# Patient Record
Sex: Male | Born: 1960 | Race: White | Hispanic: No | Marital: Single | Smoking: Former smoker
Health system: Southern US, Community
[De-identification: ages and names within clinical notes are randomized; demographics above are authoritative.]

## PROBLEM LIST (undated history)

## (undated) ENCOUNTER — Emergency Department: Discharge status: Absent without leave | Payer: Medicare Other

## (undated) DIAGNOSIS — I4891 Unspecified atrial fibrillation: Secondary | ICD-10-CM

## (undated) DIAGNOSIS — I2510 Atherosclerotic heart disease of native coronary artery without angina pectoris: Secondary | ICD-10-CM

## (undated) DIAGNOSIS — N289 Disorder of kidney and ureter, unspecified: Secondary | ICD-10-CM

## (undated) DIAGNOSIS — E785 Hyperlipidemia, unspecified: Secondary | ICD-10-CM

## (undated) HISTORY — PX: CARPAL TUNNEL RELEASE: SHX101

## (undated) HISTORY — PX: CORONARY ARTERY BYPASS GRAFT: SHX141

## (undated) HISTORY — DX: Hyperlipidemia, unspecified: E78.5

## (undated) HISTORY — DX: Atherosclerotic heart disease of native coronary artery without angina pectoris: I25.10

## (undated) HISTORY — PX: OTHER SURGICAL HISTORY: SHX169

---

## 2004-09-23 ENCOUNTER — Inpatient Hospital Stay (HOSPITAL_COMMUNITY): Admission: EM | Admit: 2004-09-23 | Discharge: 2004-09-25 | Payer: Self-pay | Admitting: Emergency Medicine

## 2007-07-14 ENCOUNTER — Emergency Department: Payer: Self-pay | Admitting: Unknown Physician Specialty

## 2007-11-04 ENCOUNTER — Other Ambulatory Visit: Payer: Self-pay

## 2007-11-04 ENCOUNTER — Inpatient Hospital Stay: Payer: Self-pay | Admitting: Internal Medicine

## 2008-11-29 ENCOUNTER — Ambulatory Visit: Payer: Self-pay | Admitting: Cardiology

## 2008-11-29 ENCOUNTER — Inpatient Hospital Stay (HOSPITAL_COMMUNITY): Admission: EM | Admit: 2008-11-29 | Discharge: 2008-12-09 | Payer: Self-pay | Admitting: Emergency Medicine

## 2008-12-01 ENCOUNTER — Encounter: Payer: Self-pay | Admitting: Surgery

## 2008-12-01 ENCOUNTER — Ambulatory Visit: Payer: Self-pay | Admitting: Surgery

## 2008-12-19 ENCOUNTER — Ambulatory Visit: Payer: Self-pay | Admitting: Surgery

## 2008-12-22 ENCOUNTER — Ambulatory Visit: Payer: Self-pay | Admitting: Cardiology

## 2009-01-01 ENCOUNTER — Encounter: Admission: RE | Admit: 2009-01-01 | Discharge: 2009-01-01 | Payer: Self-pay | Admitting: Surgery

## 2009-01-01 ENCOUNTER — Ambulatory Visit: Payer: Self-pay | Admitting: Surgery

## 2009-04-16 IMAGING — CR DG CHEST 2V
2 series · 2 of 2 positions shown · non-contrast
Comparison: 12/08/2007

CLINICAL DATA: Coronary artery disease.  CABG.

CHEST - 2 VIEW

[w chest pa]
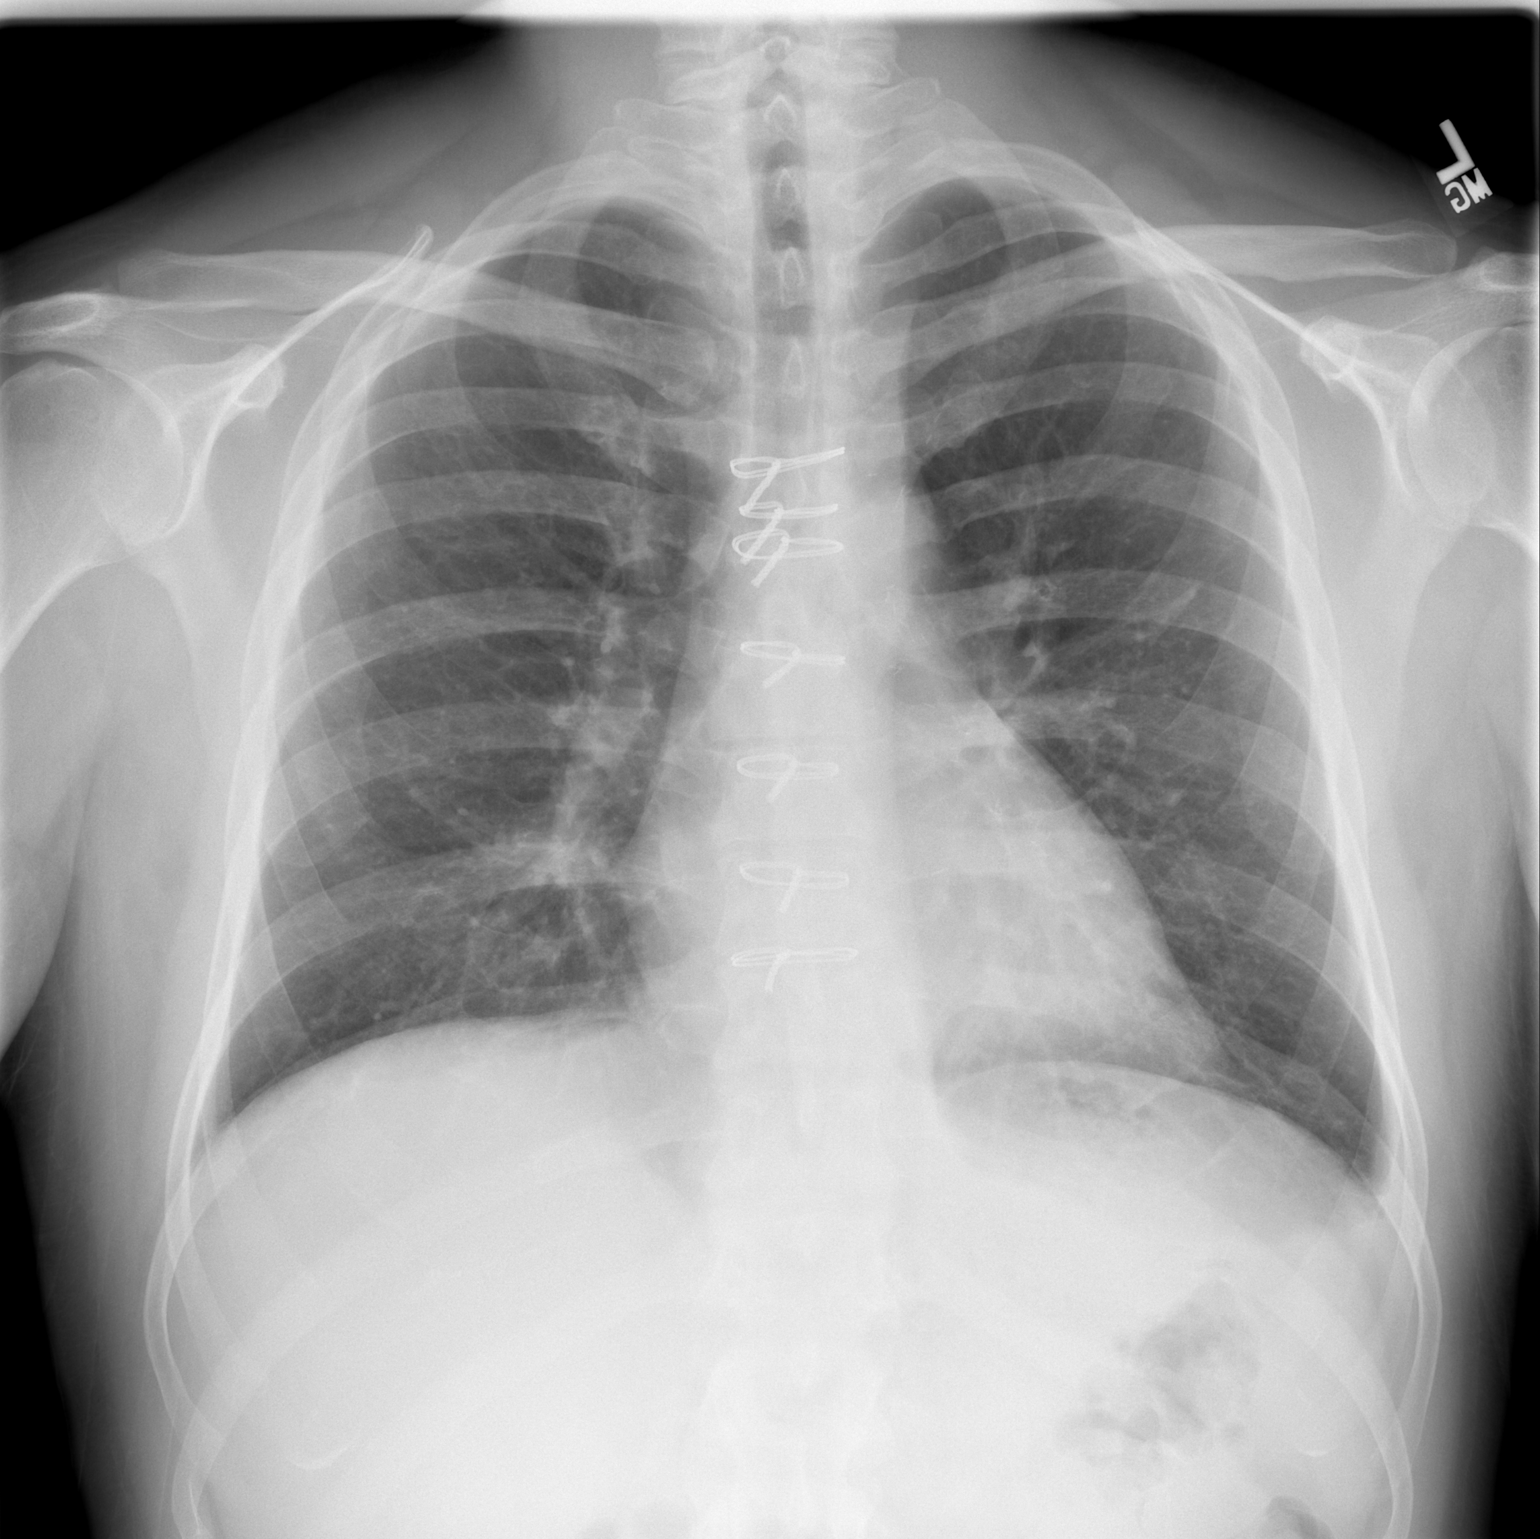

[w chest lat]
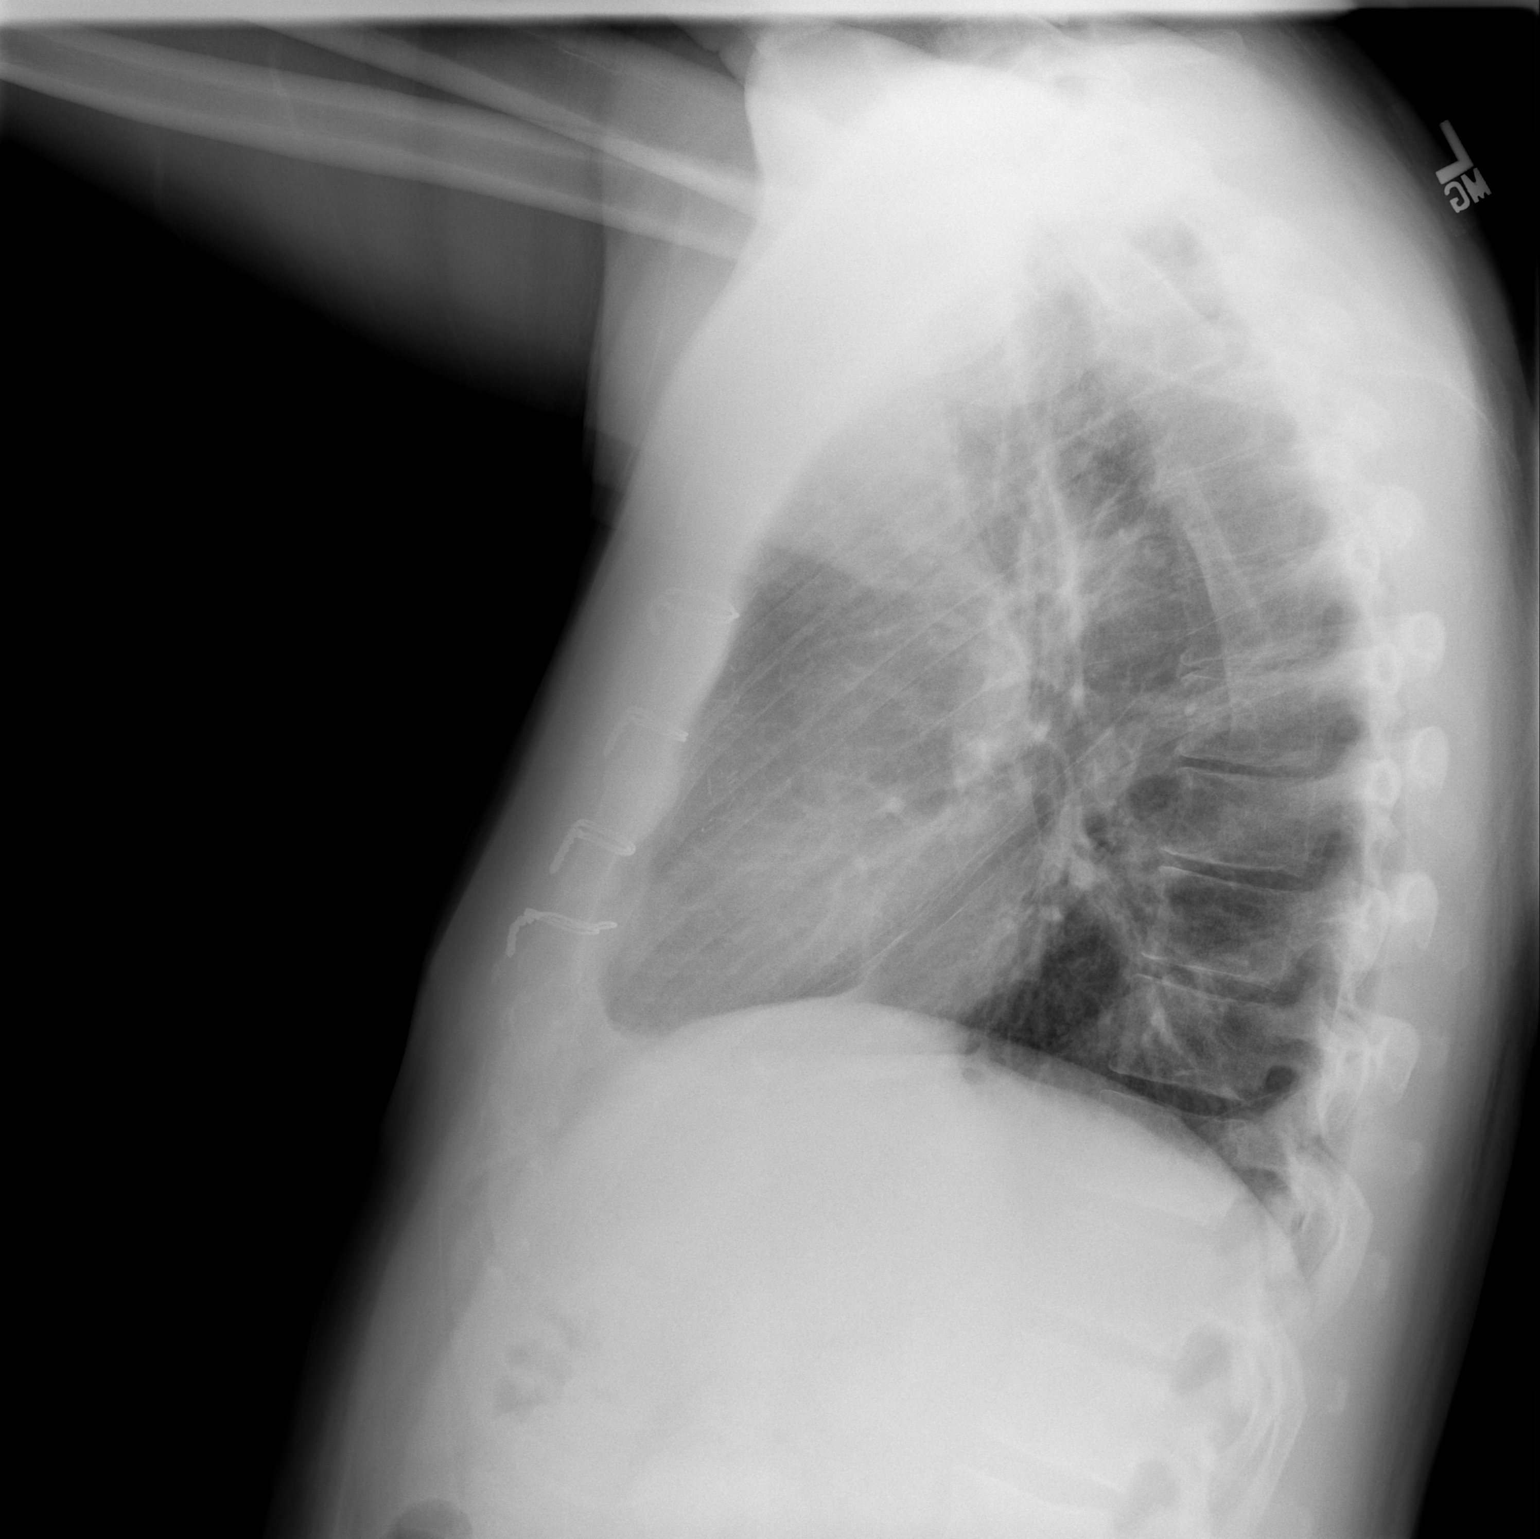

[2 of 2 positions shown; findings below may reference images not displayed]

FINDINGS: Continued normalization of the postoperative chest.
Median sternotomy wires are present.  No pleural effusion or
airspace disease.  Trachea midline.  Cardiopericardial silhouette
within normal limits.
IMPRESSION: No active cardiopulmonary disease status post CABG.

## 2009-10-26 DIAGNOSIS — E785 Hyperlipidemia, unspecified: Secondary | ICD-10-CM

## 2009-10-26 DIAGNOSIS — I25119 Atherosclerotic heart disease of native coronary artery with unspecified angina pectoris: Secondary | ICD-10-CM | POA: Insufficient documentation

## 2010-01-24 ENCOUNTER — Emergency Department: Payer: Self-pay | Admitting: Unknown Physician Specialty

## 2011-01-14 ENCOUNTER — Inpatient Hospital Stay: Payer: Self-pay | Admitting: Internal Medicine

## 2011-03-10 LAB — URINALYSIS, ROUTINE W REFLEX MICROSCOPIC
Glucose, UA: >1000 mg/dL — AB
Hgb urine dipstick: NEGATIVE
Leukocytes, UA: NEGATIVE
Protein, ur: NEGATIVE mg/dL
pH: 6 (ref 5.0–8.0)

## 2011-03-10 LAB — BASIC METABOLIC PANEL
BUN: 12 mg/dL (ref 6–23)
BUN: 13 mg/dL (ref 6–23)
BUN: 19 mg/dL (ref 6–23)
CO2: 21 mEq/L (ref 19–32)
CO2: 23 mEq/L (ref 19–32)
CO2: 24 mEq/L (ref 19–32)
CO2: 24 mEq/L (ref 19–32)
CO2: 25 mEq/L (ref 19–32)
CO2: 26 mEq/L (ref 19–32)
Calcium: 8.3 mg/dL — ABNORMAL LOW (ref 8.4–10.5)
Calcium: 8.6 mg/dL (ref 8.4–10.5)
Calcium: 8.8 mg/dL (ref 8.4–10.5)
Calcium: 9.3 mg/dL (ref 8.4–10.5)
Chloride: 105 mEq/L (ref 96–112)
Chloride: 107 mEq/L (ref 96–112)
Chloride: 108 mEq/L (ref 96–112)
Chloride: 109 mEq/L (ref 96–112)
Chloride: 99 mEq/L (ref 96–112)
Creatinine, Ser: 0.9 mg/dL (ref 0.4–1.5)
Creatinine, Ser: 1 mg/dL (ref 0.4–1.5)
Creatinine, Ser: 1.04 mg/dL (ref 0.4–1.5)
Creatinine, Ser: 1.08 mg/dL (ref 0.4–1.5)
GFR calc Af Amer: >60 mL/min (ref >60)
GFR calc Af Amer: >60 mL/min (ref >60)
GFR calc Af Amer: >60 mL/min (ref >60)
GFR calc non Af Amer: >60 mL/min (ref >60)
GFR calc non Af Amer: >60 mL/min (ref >60)
Glucose, Bld: 103 mg/dL — ABNORMAL HIGH (ref 70–99)
Glucose, Bld: 111 mg/dL — ABNORMAL HIGH (ref 70–99)
Glucose, Bld: 182 mg/dL — ABNORMAL HIGH (ref 70–99)
Glucose, Bld: 360 mg/dL — ABNORMAL HIGH (ref 70–99)
Potassium: 3.5 mEq/L (ref 3.5–5.1)
Potassium: 3.7 mEq/L (ref 3.5–5.1)
Potassium: 3.8 mEq/L (ref 3.5–5.1)
Sodium: 133 mEq/L — ABNORMAL LOW (ref 135–145)
Sodium: 136 mEq/L (ref 135–145)
Sodium: 138 mEq/L (ref 135–145)
Sodium: 140 mEq/L (ref 135–145)

## 2011-03-10 LAB — GLUCOSE, CAPILLARY
Glucose-Capillary: 113 mg/dL — ABNORMAL HIGH (ref 70–99)
Glucose-Capillary: 121 mg/dL — ABNORMAL HIGH (ref 70–99)
Glucose-Capillary: 121 mg/dL — ABNORMAL HIGH (ref 70–99)
Glucose-Capillary: 122 mg/dL — ABNORMAL HIGH (ref 70–99)
Glucose-Capillary: 123 mg/dL — ABNORMAL HIGH (ref 70–99)
Glucose-Capillary: 125 mg/dL — ABNORMAL HIGH (ref 70–99)
Glucose-Capillary: 151 mg/dL — ABNORMAL HIGH (ref 70–99)
Glucose-Capillary: 157 mg/dL — ABNORMAL HIGH (ref 70–99)
Glucose-Capillary: 167 mg/dL — ABNORMAL HIGH (ref 70–99)
Glucose-Capillary: 177 mg/dL — ABNORMAL HIGH (ref 70–99)
Glucose-Capillary: 178 mg/dL — ABNORMAL HIGH (ref 70–99)
Glucose-Capillary: 185 mg/dL — ABNORMAL HIGH (ref 70–99)
Glucose-Capillary: 200 mg/dL — ABNORMAL HIGH (ref 70–99)
Glucose-Capillary: 203 mg/dL — ABNORMAL HIGH (ref 70–99)
Glucose-Capillary: 210 mg/dL — ABNORMAL HIGH (ref 70–99)
Glucose-Capillary: 247 mg/dL — ABNORMAL HIGH (ref 70–99)
Glucose-Capillary: 250 mg/dL — ABNORMAL HIGH (ref 70–99)
Glucose-Capillary: 263 mg/dL — ABNORMAL HIGH (ref 70–99)
Glucose-Capillary: 321 mg/dL — ABNORMAL HIGH (ref 70–99)
Glucose-Capillary: 327 mg/dL — ABNORMAL HIGH (ref 70–99)
Glucose-Capillary: 91 mg/dL (ref 70–99)
Glucose-Capillary: 94 mg/dL (ref 70–99)

## 2011-03-10 LAB — URINE MICROSCOPIC-ADD ON

## 2011-03-10 LAB — POCT I-STAT, CHEM 8
Chloride: 109 mEq/L (ref 96–112)
Creatinine, Ser: 1 mg/dL (ref 0.4–1.5)
Glucose, Bld: 151 mg/dL — ABNORMAL HIGH (ref 70–99)
Glucose, Bld: 211 mg/dL — ABNORMAL HIGH (ref 70–99)
HCT: 37 % — ABNORMAL LOW (ref 39.0–52.0)
HCT: 38 % — ABNORMAL LOW (ref 39.0–52.0)
Hemoglobin: 12.6 g/dL — ABNORMAL LOW (ref 13.0–17.0)
Hemoglobin: 12.9 g/dL — ABNORMAL LOW (ref 13.0–17.0)
Potassium: 3.7 mEq/L (ref 3.5–5.1)
Potassium: 4.4 mEq/L (ref 3.5–5.1)
Sodium: 139 mEq/L (ref 135–145)
Sodium: 142 mEq/L (ref 135–145)
TCO2: 20 mmol/L (ref 0–100)

## 2011-03-10 LAB — HEMOGLOBIN A1C
Hgb A1c MFr Bld: 11.8 % — ABNORMAL HIGH (ref 4.6–6.1)
Mean Plasma Glucose: 292 mg/dL

## 2011-03-10 LAB — CBC
HCT: 39.3 % (ref 39.0–52.0)
HCT: 40 % (ref 39.0–52.0)
HCT: 44.2 % (ref 39.0–52.0)
HCT: 45.3 % (ref 39.0–52.0)
Hemoglobin: 12.8 g/dL — ABNORMAL LOW (ref 13.0–17.0)
Hemoglobin: 13.1 g/dL (ref 13.0–17.0)
Hemoglobin: 13.2 g/dL (ref 13.0–17.0)
Hemoglobin: 13.5 g/dL (ref 13.0–17.0)
Hemoglobin: 14.8 g/dL (ref 13.0–17.0)
Hemoglobin: 15.6 g/dL (ref 13.0–17.0)
MCHC: 33 g/dL (ref 30.0–36.0)
MCHC: 33.4 g/dL (ref 30.0–36.0)
MCHC: 33.6 g/dL (ref 30.0–36.0)
MCHC: 34.4 g/dL (ref 30.0–36.0)
MCHC: 34.5 g/dL (ref 30.0–36.0)
MCHC: 34.8 g/dL (ref 30.0–36.0)
MCV: 89.1 fL (ref 78.0–100.0)
MCV: 90.7 fL (ref 78.0–100.0)
MCV: 91.7 fL (ref 78.0–100.0)
Platelets: 183 10*3/uL (ref 150–400)
Platelets: 212 10*3/uL (ref 150–400)
Platelets: 214 10*3/uL (ref 150–400)
Platelets: 269 10*3/uL (ref 150–400)
RBC: 4.19 MIL/uL — ABNORMAL LOW (ref 4.22–5.81)
RBC: 4.25 MIL/uL (ref 4.22–5.81)
RBC: 4.37 MIL/uL (ref 4.22–5.81)
RBC: 5.08 MIL/uL (ref 4.22–5.81)
RDW: 12.9 % (ref 11.5–15.5)
RDW: 13.1 % (ref 11.5–15.5)
RDW: 13.4 % (ref 11.5–15.5)
RDW: 13.4 % (ref 11.5–15.5)
RDW: 13.5 % (ref 11.5–15.5)
RDW: 13.8 % (ref 11.5–15.5)
WBC: 7.6 10*3/uL (ref 4.0–10.5)
WBC: 8.3 10*3/uL (ref 4.0–10.5)
WBC: 8.4 10*3/uL (ref 4.0–10.5)

## 2011-03-10 LAB — TSH
TSH: 1.3 u[IU]/mL (ref 0.350–4.500)
TSH: 2.987 u[IU]/mL (ref 0.350–4.500)

## 2011-03-10 LAB — POCT I-STAT 4, (NA,K, GLUC, HGB,HCT)
Glucose, Bld: 105 mg/dL — ABNORMAL HIGH (ref 70–99)
Glucose, Bld: 107 mg/dL — ABNORMAL HIGH (ref 70–99)
HCT: 40 % (ref 39.0–52.0)
Hemoglobin: 13.3 g/dL (ref 13.0–17.0)
Hemoglobin: 13.6 g/dL (ref 13.0–17.0)
Potassium: 3.4 mEq/L — ABNORMAL LOW (ref 3.5–5.1)
Sodium: 143 mEq/L (ref 135–145)
Sodium: 145 mEq/L (ref 135–145)

## 2011-03-10 LAB — POCT I-STAT 3, ART BLOOD GAS (G3+)
Acid-base deficit: 4 mmol/L — ABNORMAL HIGH (ref 0.0–2.0)
Patient temperature: 35.5
pH, Arterial: 7.467 — ABNORMAL HIGH (ref 7.350–7.450)
pO2, Arterial: 136 mmHg — ABNORMAL HIGH (ref 80.0–100.0)

## 2011-03-10 LAB — TYPE AND SCREEN

## 2011-03-10 LAB — POCT CARDIAC MARKERS
CKMB, poc: 1.8 ng/mL (ref 1.0–8.0)
CKMB, poc: 1.8 ng/mL (ref 1.0–8.0)
Myoglobin, poc: 67.7 ng/mL (ref 12–200)
Myoglobin, poc: 80 ng/mL (ref 12–200)
Troponin i, poc: <0.05 ng/mL (ref 0.00–0.09)
Troponin i, poc: <0.05 ng/mL (ref 0.00–0.09)

## 2011-03-10 LAB — BLOOD GAS, ARTERIAL
Patient temperature: 98.6
pCO2 arterial: 35.3 mmHg (ref 35.0–45.0)
pH, Arterial: 7.419 (ref 7.350–7.450)

## 2011-03-10 LAB — COMPREHENSIVE METABOLIC PANEL
ALT: 20 U/L (ref 0–53)
AST: 15 U/L (ref 0–37)
Albumin: 3 g/dL — ABNORMAL LOW (ref 3.5–5.2)
Albumin: 3.7 g/dL (ref 3.5–5.2)
Alkaline Phosphatase: 47 U/L (ref 39–117)
Alkaline Phosphatase: 64 U/L (ref 39–117)
BUN: 13 mg/dL (ref 6–23)
BUN: 21 mg/dL (ref 6–23)
CO2: 24 mEq/L (ref 19–32)
CO2: 25 mEq/L (ref 19–32)
Calcium: 9.7 mg/dL (ref 8.4–10.5)
Chloride: 105 mEq/L (ref 96–112)
Chloride: 97 mEq/L (ref 96–112)
Creatinine, Ser: 0.85 mg/dL (ref 0.4–1.5)
Creatinine, Ser: 1.09 mg/dL (ref 0.4–1.5)
GFR calc Af Amer: >60 mL/min (ref >60)
GFR calc non Af Amer: >60 mL/min (ref >60)
GFR calc non Af Amer: >60 mL/min (ref >60)
Glucose, Bld: 178 mg/dL — ABNORMAL HIGH (ref 70–99)
Glucose, Bld: 415 mg/dL — ABNORMAL HIGH (ref 70–99)
Potassium: 3 mEq/L — ABNORMAL LOW (ref 3.5–5.1)
Potassium: 3.4 mEq/L — ABNORMAL LOW (ref 3.5–5.1)
Sodium: 134 mEq/L — ABNORMAL LOW (ref 135–145)
Total Bilirubin: 0.3 mg/dL (ref 0.3–1.2)
Total Bilirubin: 0.9 mg/dL (ref 0.3–1.2)
Total Protein: 6.9 g/dL (ref 6.0–8.3)

## 2011-03-10 LAB — DIFFERENTIAL
Basophils Absolute: 0 10*3/uL (ref 0.0–0.1)
Basophils Relative: 1 % (ref 0–1)
Eosinophils Absolute: 0.2 10*3/uL (ref 0.0–0.7)
Eosinophils Relative: 2 % (ref 0–5)
Lymphocytes Relative: 33 % (ref 12–46)
Lymphs Abs: 2.7 10*3/uL (ref 0.7–4.0)
Monocytes Absolute: 0.8 10*3/uL (ref 0.1–1.0)
Monocytes Relative: 10 % (ref 3–12)
Neutro Abs: 4.5 10*3/uL (ref 1.7–7.7)
Neutrophils Relative %: 55 % (ref 43–77)

## 2011-03-10 LAB — TROPONIN I: Troponin I: <0.01 ng/mL (ref 0.00–0.06)

## 2011-03-10 LAB — LIPID PANEL
Cholesterol: 264 mg/dL — ABNORMAL HIGH (ref 0–200)
HDL: 32 mg/dL — ABNORMAL LOW (ref >39)
LDL Cholesterol: UNDETERMINED mg/dL (ref 0–99)
Total CHOL/HDL Ratio: 8.3 RATIO
Triglycerides: 550 mg/dL — ABNORMAL HIGH (ref <150)
VLDL: UNDETERMINED mg/dL (ref 0–40)

## 2011-03-10 LAB — PROTIME-INR
Prothrombin Time: 12.9 seconds (ref 11.6–15.2)
Prothrombin Time: 14.5 seconds (ref 11.6–15.2)

## 2011-03-10 LAB — BRAIN NATRIURETIC PEPTIDE: Pro B Natriuretic peptide (BNP): <30 pg/mL (ref 0.0–100.0)

## 2011-03-10 LAB — RAPID URINE DRUG SCREEN, HOSP PERFORMED
Barbiturates: NOT DETECTED
Benzodiazepines: NOT DETECTED

## 2011-03-10 LAB — CK TOTAL AND CKMB (NOT AT ARMC)
CK, MB: 3 ng/mL (ref 0.3–4.0)
Relative Index: 2.6 — ABNORMAL HIGH (ref 0.0–2.5)
Total CK: 115 U/L (ref 7–232)

## 2011-03-10 LAB — CARDIAC PANEL(CRET KIN+CKTOT+MB+TROPI)
Total CK: 130 U/L (ref 7–232)
Total CK: 147 U/L (ref 7–232)
Troponin I: 0.01 ng/mL (ref 0.00–0.06)
Troponin I: 0.01 ng/mL (ref 0.00–0.06)

## 2011-03-10 LAB — CREATININE, SERUM
Creatinine, Ser: 0.82 mg/dL (ref 0.4–1.5)
GFR calc non Af Amer: >60 mL/min (ref >60)

## 2011-03-10 LAB — HEPARIN LEVEL (UNFRACTIONATED)
Heparin Unfractionated: 0.17 IU/mL — ABNORMAL LOW (ref 0.30–0.70)
Heparin Unfractionated: <0.1 IU/mL — ABNORMAL LOW (ref 0.30–0.70)

## 2011-03-10 LAB — APTT
aPTT: 28 seconds (ref 24–37)
aPTT: 30 seconds (ref 24–37)

## 2011-03-10 LAB — PHOSPHORUS: Phosphorus: 2.8 mg/dL (ref 2.3–4.6)

## 2011-03-10 LAB — MAGNESIUM: Magnesium: 2 mg/dL (ref 1.5–2.5)

## 2011-03-10 LAB — D-DIMER, QUANTITATIVE: D-Dimer, Quant: 0.29 ug/mL-FEU (ref 0.00–0.48)

## 2011-04-08 NOTE — Op Note (Signed)
[name], [name] NO.:  [id number]   MEDICAL RECORD NO.:  [id number]          PATIENT TYPE:  INP   LOCATION:  2310                         FACILITY:  [facility]   PHYSICIAN:  Gilford Raid, M.D.     DATE OF BIRTH:  [birth year]   DATE OF PROCEDURE:  12/04/2008  DATE OF DISCHARGE:                               OPERATIVE REPORT   PREOPERATIVE DIAGNOSIS:  Severe single-vessel coronary artery disease.   POSTOPERATIVE DIAGNOSIS:  Severe single-vessel coronary artery disease.   OPERATIVE PROCEDURE:  Off-pump coronary artery bypass graft surgery x1  using a left internal mammary artery graft to the left anterior  descending coronary artery.   SURGEON:  Gilford Raid, MD   ASSISTANT:  Suzzanne Cloud, PA-C   ANESTHESIA:  General endotracheal.   CLINICAL HISTORY:  This patient is a 50 year old gentleman with poorly  controlled diabetes, newly diagnosed hyperlipidemia, and history of  heavy smoking until about 3 years ago who also has a family history of  premature coronary artery disease and presented with a several-week  history of fatigue and new-onset chest pain.  He ruled out for  myocardial infarction but had a positive stress adenosine scan.  Cardiac  catheterization showed a long 90% proximal and mid-LAD stenosis.  There  was nonobstructive disease in the left circumflex and right coronary  territories.  It was felt that the best treatment for him would be  coronary artery bypass surgery with a left internal mammary graft to the  LAD given his history of diabetes that was poorly controlled as well as  the long LAD stenosis.  I discussed the operative procedure with him  including alternatives, benefits, and risks including but not limited to  bleeding, blood transfusion, infection, stroke, myocardial infarction,  graft failure, and death.  He understood all this and agreed to proceed.   OPERATIVE PROCEDURE:  The patient was taken to the operating room and  placed  on the table in supine position.  After induction of general  endotracheal anesthesia, a Foley catheter was placed in the bladder  using sterile technique.  Then the chest, abdomen and both lower  extremities were prepped and draped in the usual sterile manner.  The  chest was then entered through a median sternotomy incision and the  pericardium opened midline.  Examination of the heart showed good  ventricular contractility.  The ascending aorta had no palpable plaques  in it.   Then the left internal mammary artery was harvested from the chest wall  as a pedicle graft.  This was a medium caliber vessel with excellent  blood flow through it.   Then the patient was given the off-pump heparin dose of 13,000 units.  ACT was measured and was kept at around 300 seconds.  The off-pump  retractor was placed.  A slit was made in the left pericardium anterior  to the phrenic nerve to allow space for the left internal mammary  pedicle.  The mammary artery was prepared.  He had excellent blood flow  through it.  Then two moist laparotomy pads were placed behind the heart  to expose the LAD.  The LAD had no significant distal disease in it.  It  was on the surface of the heart and easily exposed.  Hemodynamic  stability was good.  Then the Octopus off-pump stabilizer was placed.  Tapes were placed around the mid LAD, proximal and distal to the planned  anastomotic site for hemostasis.  With the retractor placed, there was  good stabilization of the LAD.  The LAD was then opened and the Silastic  tapes were tightened.  There was fairly good hemostasis.  The internal  diameter of the vessel was about 2 mm.  Then, the left internal mammary  pedicle was anastomosed to the LAD in an end-to-side manner using  continuous 8-0 Prolene suture.  The pedicle was sutured to the  epicardium with 6-0 Prolene sutures.  Then, protamine was given.  There  was good hemostasis.  The laparotomy pads were removed from  the  pericardial cavity.  Then two temporary right ventricular and right  atrial pacing wires were placed and brought out through the skin.  Hemostasis was achieved.  Three chest tubes were placed with two in the  posterior pericardium, one in the anterior mediastinum and one in the  left pleural space.  The sternum was then closed with double #6  stainless steel wires.  The fascia was closed with continuous #1 Vicryl  suture.  Subcutaneous tissue was closed with continuous 2-0 Vicryl and  the skin with a 3-0 Vicryl subcuticular closure.  The sponge, needle,  and instrument counts were correct according to scrub nurse.  Dry  sterile dressing was applied over the incisions around the chest tubes  which were Pleur-evac suction.  The patient remained hemodynamically  stable and was transported to the SICU in guarded but stable condition.      Gilford Raid, M.D.  Electronically Signed     BB/MEDQ  D:  12/04/2008  T:  12/05/2008  Job:  [id number]   cc:   [facility]
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
## 2011-04-08 NOTE — Group Therapy Note (Signed)
NAMECHASEN, ERTL NO.:  [id number]   MEDICAL RECORD NO.:  [id number]          PATIENT TYPE:  INP   LOCATION:  [id number]                         FACILITY:  [facility]   PHYSICIAN:  Reyne Dumas, MD       DATE OF BIRTH:  04-Dec-1960                                 PROGRESS NOTE   SUBJECTIVE:  Mr. Rheinheimer has been chest pain-free over night.  He was scheduled for a  cardiac cath today.  No events on telemetry monitoring.   VITAL SIGNS:  Blood pressure 134/77, pulse of 97, temperature 97.1,  respirations 16.  AO x3.  LUNGS:  Clear to auscultation bilaterally.  No wheezes, no crackles or  rhonchi.  CARDIOVASCULAR:  Regular rate and rhythm, no appreciable murmurs, rubs  or gallops.  ABDOMEN:  Soft, nontender, nondistended.  EXTREMITIES:  Reveal no cyanosis, clubbing or edema.   LABS:  Fingerstick blood glucose 203 and then 139.   ASSESSMENT/PLAN:  1. Chest pain.  The patient is scheduled for a cardiac catheterization      today based on his stress test yesterday.  He was found to have a      90% mid left anterior descending blockage and Thoracic Surgery is      being consulted for possible cornonary artery bypass.  Patient to      continue with his aspirin, TriCor, lisinopril, metoprolol and      Zocor.  2. Diabetes.  Patient to continue with inpatient NovoLog sliding scale      insulin.  3. Dyslipidemia.  Continue with Zocor and TriCor.   DISPOSITION:  1. Diabetes education today.  2. Possible bypass.  3. Cardiothoracic Surgery consult to be done today.      Reyne Dumas, MD  Electronically Signed     NA/MEDQ  D:  12/01/2008  T:  12/01/2008  Job:  [id number]

## 2011-04-08 NOTE — Assessment & Plan Note (Signed)
OFFICE VISIT   [name], [name]  DOB:  1961/10/30                                        January 01, 2009  CHART #:  [id number]   The patient comes in today for postoperative followup.  He is status  post off-pump coronary artery bypass grafting x1 (left internal mammary  artery to the LAD) on 12/04/2008.  His postoperative course was  relatively uneventful and he was discharged home on December 09, 2008,  without problem.  Since his discharge home, he has been doing well.  He  still notices some shortness of breath with extreme exertion such as  walking uphill, but is overall progressing well.  He has had a good  appetite and has not noticed any chest pain or lower extremity edema.  He has postoperative follow up with Dr. Aundra Dubin and got a good report.  His blood pressure was stable and he was started on lisinopril 2.5 mg  daily, which he seems to be tolerating.  He also has been followed by  his primary care physician with adjustment of his insulin doses.  He  states that his blood sugars have still been a little on the high side,  but they are improving.   PHYSICAL EXAMINATION:  VITAL SIGNS:  Blood pressure 101/67, pulse is 92,  respirations 18, and O2 sat 98% on room air.  CHEST:  Sternal incision and chest tube sites are healing well without  erythema or drainage.  Sternum is stable to palpation.  HEART:  Regular rate and rhythm without murmurs, rubs, or gallops.  LUNGS:  Clear to auscultation.  EXTREMITIES:  Without edema.   Chest x-ray shows minimal atelectasis or effusions and sternal wires in  good position.   ASSESSMENT AND PLAN:  The patient is doing very well status post off-  pump coronary artery bypass graft x1.  He has routine followup scheduled  with Dr. Aundra Dubin and with his primary care physician.  We will see him  back on a p.r.n. basis hereafter.  He is asked to call in  the interim if he experiences any problems or has questions.  He  is  released to begin driving and to increase his regular activity.   Gilford Raid, M.D.  Electronically Signed   GC/MEDQ  D:  01/01/2009  T:  01/01/2009  Job:  [id number]   cc:   Loralie Champagne, MD  Dr. Nori Riis
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
## 2011-04-08 NOTE — Discharge Summary (Signed)
NAMEMAKEL, ELTING NO.:  [id number]   MEDICAL RECORD NO.:  [id number]          PATIENT TYPE:  INP   LOCATION:  [id number]                         FACILITY:  [place]   PHYSICIAN:  Reyne Dumas, MD       DATE OF BIRTH:  24-Feb-1961   DATE OF ADMISSION:  11/28/2008  DATE OF DISCHARGE:  12/01/2008                               DISCHARGE SUMMARY   DISCHARGE DIAGNOSES:  1. Chest pain, ruled out for acute coronary syndrome.  2. Uncontrolled diabetes.  3. Probable coronary artery disease pending stress test.   SUBJECTIVE:  This is a 50 year old male with a history of diabetes who  started experiencing sharp left-sided chest pain 4-6/10 with radiation  of the pain done into his left arm without any associated nausea,  vomiting or diaphoresis.  The patient has multiple cardiovascular risk  factors for coronary artery disease.  EKG showed normal sinus rhythm  without any significant ST-T segment changes.  Chest x-ray showed  suboptimal inspiration with bibasilar atelectasis.   The patient was evaluated by [facility], scheduled for her  myocardial SPECT imaging study on January 7 that showed subtle area of  reversible decreased perfusion at the anterior apex of the left  ventricle, normal left ventricular sided ejection fraction of 56% with  normal LV wall motion.  The patient was subsequently scheduled for a  cardiac catheterization which is pending at this time. Discharge timing  and disposition would depend on the results of his cardiac cath.   Diabetes.  The patient was found to have a hemoglobin A1c of 11.8 and a  cholesterol panel with a total cholesterol of 264, triglycerides 550,  HDL 32, LDL.  The patient was started on Tricor and Zocor.  The patient  is recommended to have a follow-up liver function panel and CK in 4-6  weeks and a repeat fasting cholesterol panel at that time.   Diabetes.  The patient is recommended to start NovoLog premeals and also  continue with his Novolin 25 units twice a day.   DISCHARGE MEDICATIONS:  1. TriCor 145 p.o. daily.  2. Lisinopril 5 mg daily.  3. Protonix 40 mg p.o. daily.  4. Zocor 40 mg p.o. daily.  5. Toprol-XL 25 mg p.o. daily.  6. NovoLog premeals 6 units q.a.c.  7. Novolin N 25 units twice a day.  8. Aspirin 325 mg p.o. daily.      Reyne Dumas, MD  Electronically Signed     NA/MEDQ  D:  12/01/2008  T:  12/01/2008  Job:  [id number]

## 2011-04-08 NOTE — Cardiovascular Report (Signed)
[name], [name] NO.:  [id number]   MEDICAL RECORD NO.:  [id number]          PATIENT TYPE:  INP   LOCATION:  [id number]                         FACILITY:  [place] [facility]   PHYSICIAN:  Juanda Bond. Burt Knack, MD  DATE OF BIRTH:  [birth year]   DATE OF PROCEDURE:  12/01/2008  DATE OF DISCHARGE:                            CARDIAC CATHETERIZATION   PROCEDURE:  Left heart catheterization, selective coronary angiography,  left ventricular angiography.   INDICATION:  [name] is a 50 year old gentleman with multiple cardiac  risk factors including insulin dependent diabetes, hypertension and  dyslipidemia.  His diabetes has been poorly controlled.  When he was at  work yesterday he developed substernal chest discomfort with exertion.  He underwent a Myoview stress scan that showed anterolateral ischemia.  He was referred for cardiac catheterization as an inpatient.   Risks and indications of the procedure were reviewed with the patient.  Informed consent was obtained.  The right groin was prepped and draped  and anesthetized with 1% lidocaine.  Using modified Seldinger technique  a 5 French sheath was placed in the right femoral artery.  Standard 5  French Judkins catheters were used for coronary angiography and left  ventriculography.  All catheter exchanges were performed over guidewire.  The patient tolerated the procedure well.  There were no immediate  complications.   FINDINGS:  Aortic pressure 82/53 with a mean of 65, left ventricular  pressure 86/6.   Left ventriculography:  Left ventricular function is normal by 30 degree  RAO ventriculography.  There is no significant mitral regurgitation.  The proximal aortic root appears normal.  The LVEF is estimated at 60-  65%.   Coronary angiography, left mainstem:  The left main is patent.  There is  very mild narrowing at the distal left main of no more than 20%.  The  left main supplies the LAD and left circumflex  branches.   LAD:  The LAD is a large caliber vessel that wraps around the LV apex.  There is a 30% ostial stenosis present.  The proximal vessel has luminal  irregularities.  Then, after the first septal perforator, the LAD has a  long segment of severe stenosis throughout the midportion of the vessel.  This involves two diagonal branches.  The first diagonal is moderate  sized, the second diagonal is small.  The vessel then returns to a  normal sized vessel and has no significant stenosis throughout the  remaining portions of the mid and distal LAD.  The vessel goes on to  wrap around the apex and supplies the anteroapical wall of the LV.   Left circumflex:  The left circumflex has mild disease.  There is an  intermediate branch that has nonobstructive stenosis.  The intermediate  is small.  The proximal left circumflex has 30% stenosis.  There is a  moderate sized first OM that has a 50% proximal stenosis.  The AV groove  circumflex in the midportion beyond the OM branch has 50% stenosis as  well.  It supplies only a small posterolateral branch.   Right coronary artery:  The right  coronary artery is dominant.  The  vessel has 30% proximal stenosis and 40-50% stenosis in the distal  vessel.  It supplies multiple distal branches including a diffusely  diseased PDA that has no discrete or severe stenoses.  There is diffuse  50% stenosis of the proximal vessel.  It also supplies three  posterolateral branches that have no significant stenosis.   ASSESSMENT:  1. Severe mid LAD (left anterior descending) stenosis.  2. Nonobstructive left circumflex and right coronary artery stenosis.  3. Normal left ventricular function.   PLAN:  [name] has a long segment of severe mid LAD stenosis that  requires revascularization.  Technically, it is treatable by PCI.  However, it would involve a long drug-eluting stent and possibly  overlapping stents.  In the setting of insulin requiring diabetes  and  questionable ability for long-term Plavix, I think the patient would be  better served by coronary bypass with a LIMA to LAD.  Will place a  surgical consultation with the CVTS group.  The patient will remain on  telemetry and will plan on starting IV heparin after a waiting period  from sheath removal.  Will continue current medical therapy and monitor  closely for recurrent ischemic symptoms.      Juanda Bond. Burt Knack, MD  Electronically Signed     MDC/MEDQ  D:  12/01/2008  T:  12/01/2008  Job:  [id number]
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
## 2011-04-11 NOTE — Discharge Summary (Signed)
[name], [name] NO.:  [id number]   MEDICAL RECORD NO.:  [id number]          PATIENT TYPE:  INP   LOCATION:  [id number]                         FACILITY:  [facility]   PHYSICIAN:  Barbette Merino, M.D.      DATE OF BIRTH:  May 23, 1961   DATE OF ADMISSION:  09/23/2004  DATE OF DISCHARGE:  09/25/2004                                 DISCHARGE SUMMARY   ADDENDUM:  Fasting lipid panel showed cholesterol of 253, triglycerides 442.  HDL and LDL not calculated.  The patient therefore does have some  dyslipidemia.  We will therefore make adjustment to his medication with an  addition of Lipitor 10 mg q.d. and subsequent follow-up by his primary care  physician.       LG/MEDQ  D:  09/25/2004  T:  09/25/2004  Job:  [id number]
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
## 2011-05-06 ENCOUNTER — Encounter: Payer: Self-pay | Admitting: Cardiovascular Disease

## 2011-08-13 ENCOUNTER — Emergency Department: Payer: Self-pay | Admitting: Emergency Medicine

## 2011-11-25 HISTORY — PX: AMPUTATION: SHX166

## 2012-10-14 ENCOUNTER — Encounter: Payer: Self-pay | Admitting: Cardiology

## 2013-05-21 ENCOUNTER — Emergency Department: Payer: Self-pay | Admitting: Emergency Medicine

## 2013-05-21 LAB — CBC
HCT: 32.3 % — ABNORMAL LOW (ref 40.0–52.0)
MCV: 89 fL (ref 80–100)
Platelet: 163 10*3/uL (ref 150–440)
RBC: 3.64 10*6/uL — ABNORMAL LOW (ref 4.40–5.90)
RDW: 14 % (ref 11.5–14.5)

## 2013-05-21 LAB — COMPREHENSIVE METABOLIC PANEL
Alkaline Phosphatase: 114 U/L (ref 50–136)
Creatinine: 3.58 mg/dL — ABNORMAL HIGH (ref 0.60–1.30)
EGFR (African American): 22 — ABNORMAL LOW
Glucose: 239 mg/dL — ABNORMAL HIGH (ref 65–99)
Osmolality: 297 (ref 275–301)
SGOT(AST): 29 U/L (ref 15–37)
SGPT (ALT): 32 U/L (ref 12–78)
Sodium: 141 mmol/L (ref 136–145)
Total Protein: 6.9 g/dL (ref 6.4–8.2)

## 2013-05-21 LAB — CK TOTAL AND CKMB (NOT AT ARMC): CK, Total: 642 U/L — ABNORMAL HIGH (ref 35–232)

## 2013-05-21 LAB — TROPONIN I: Troponin-I: <0.02 ng/mL

## 2015-04-01 ENCOUNTER — Emergency Department
Admission: EM | Admit: 2015-04-01 | Discharge: 2015-04-01 | Disposition: A | Discharge status: Home / Self Care | Payer: Medicare Other | Attending: Emergency Medicine | Admitting: Emergency Medicine

## 2015-04-01 ENCOUNTER — Emergency Department: Payer: Medicare Other

## 2015-04-01 DIAGNOSIS — S0083XA Contusion of other part of head, initial encounter: Secondary | ICD-10-CM

## 2015-04-01 DIAGNOSIS — S01511A Laceration without foreign body of lip, initial encounter: Secondary | ICD-10-CM

## 2015-04-01 DIAGNOSIS — S0993XA Unspecified injury of face, initial encounter: Secondary | ICD-10-CM | POA: Diagnosis present

## 2015-04-01 DIAGNOSIS — E119 Type 2 diabetes mellitus without complications: Secondary | ICD-10-CM | POA: Insufficient documentation

## 2015-04-01 DIAGNOSIS — W010XXA Fall on same level from slipping, tripping and stumbling without subsequent striking against object, initial encounter: Secondary | ICD-10-CM | POA: Insufficient documentation

## 2015-04-01 DIAGNOSIS — Y9289 Other specified places as the place of occurrence of the external cause: Secondary | ICD-10-CM | POA: Insufficient documentation

## 2015-04-01 DIAGNOSIS — R52 Pain, unspecified: Secondary | ICD-10-CM

## 2015-04-01 DIAGNOSIS — Y9389 Activity, other specified: Secondary | ICD-10-CM | POA: Diagnosis not present

## 2015-04-01 DIAGNOSIS — Y998 Other external cause status: Secondary | ICD-10-CM | POA: Diagnosis not present

## 2015-04-01 MED ORDER — CEPHALEXIN 500 MG PO CAPS: 500.0000 mg | ORAL_CAPSULE | Freq: Three times a day (TID) | ORAL | Status: AC

## 2015-04-01 MED ORDER — LIDOCAINE-EPINEPHRINE (PF) 1 %-1:200000 IJ SOLN
INTRAMUSCULAR | Status: AC
  Administered 2015-04-01: 1.5 mL
  Filled 2015-04-01: qty 30

## 2015-04-01 NOTE — Discharge Instructions (Signed)
Follow up with a dentist if any continued problems.  Soft foods for next 2 days.  Tylenol or ibuprofen for pain  Return to ER if any signs of infection such as pus or fever

## 2015-04-01 NOTE — ED Notes (Signed)
Pt informed to return if any life threatening symptoms occur.  
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
## 2015-04-01 NOTE — ED Notes (Signed)
Pt states he tripped and fell this morning and injured his lip and nose..denies LOC.Marland Kitchen

## 2015-04-06 ENCOUNTER — Encounter: Payer: Self-pay | Admitting: Emergency Medicine

## 2015-04-06 ENCOUNTER — Emergency Department
Admission: EM | Admit: 2015-04-06 | Discharge: 2015-04-06 | Disposition: A | Discharge status: Home / Self Care | Payer: Medicare Other | Attending: Emergency Medicine | Admitting: Emergency Medicine

## 2015-04-06 DIAGNOSIS — Z792 Long term (current) use of antibiotics: Secondary | ICD-10-CM | POA: Diagnosis not present

## 2015-04-06 DIAGNOSIS — E119 Type 2 diabetes mellitus without complications: Secondary | ICD-10-CM | POA: Diagnosis not present

## 2015-04-06 DIAGNOSIS — Z4802 Encounter for removal of sutures: Secondary | ICD-10-CM | POA: Diagnosis present

## 2015-04-06 DIAGNOSIS — Z87891 Personal history of nicotine dependence: Secondary | ICD-10-CM | POA: Insufficient documentation
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
## 2015-04-06 NOTE — ED Notes (Signed)
Suture removal to upper lip

## 2015-04-06 NOTE — Discharge Instructions (Signed)

## 2016-07-07 ENCOUNTER — Encounter: Payer: Self-pay | Admitting: Emergency Medicine

## 2016-07-07 ENCOUNTER — Emergency Department
Admission: EM | Admit: 2016-07-07 | Discharge: 2016-07-07 | Disposition: A | Discharge status: Home / Self Care | Payer: Medicare Other | Attending: Student in an Organized Health Care Education/Training Program | Admitting: Student in an Organized Health Care Education/Training Program

## 2016-07-07 ENCOUNTER — Emergency Department: Payer: Medicare Other

## 2016-07-07 DIAGNOSIS — E119 Type 2 diabetes mellitus without complications: Secondary | ICD-10-CM | POA: Insufficient documentation

## 2016-07-07 DIAGNOSIS — I2510 Atherosclerotic heart disease of native coronary artery without angina pectoris: Secondary | ICD-10-CM | POA: Insufficient documentation

## 2016-07-07 DIAGNOSIS — R0789 Other chest pain: Secondary | ICD-10-CM | POA: Insufficient documentation

## 2016-07-07 DIAGNOSIS — Z87891 Personal history of nicotine dependence: Secondary | ICD-10-CM | POA: Insufficient documentation

## 2016-07-07 DIAGNOSIS — Z951 Presence of aortocoronary bypass graft: Secondary | ICD-10-CM | POA: Insufficient documentation

## 2016-07-07 DIAGNOSIS — R079 Chest pain, unspecified: Secondary | ICD-10-CM

## 2016-07-07 HISTORY — DX: Disorder of kidney and ureter, unspecified: N28.9

## 2016-07-07 HISTORY — DX: Unspecified atrial fibrillation: I48.91

## 2016-07-07 LAB — DIFFERENTIAL
BASOS ABS: 0.1 10*3/uL (ref 0–0.1)
BASOS PCT: 1 %
EOS ABS: 0.2 10*3/uL (ref 0–0.7)
Eosinophils Relative: 3 %
Lymphocytes Relative: 20 %
Lymphs Abs: 1.6 10*3/uL (ref 1.0–3.6)
MONO ABS: 0.7 10*3/uL (ref 0.2–1.0)
MONOS PCT: 8 %
Neutro Abs: 5.4 10*3/uL (ref 1.4–6.5)
Neutrophils Relative %: 68 %

## 2016-07-07 LAB — COMPREHENSIVE METABOLIC PANEL
ALK PHOS: 84 U/L (ref 38–126)
ALT: 25 U/L (ref 17–63)
AST: 22 U/L (ref 15–41)
Albumin: 3.7 g/dL (ref 3.5–5.0)
Anion gap: 7 (ref 5–15)
BILIRUBIN TOTAL: 0.6 mg/dL (ref 0.3–1.2)
BUN: 64 mg/dL — ABNORMAL HIGH (ref 6–20)
CALCIUM: 9.3 mg/dL (ref 8.9–10.3)
CO2: 26 mmol/L (ref 22–32)
CREATININE: 4.38 mg/dL — AB (ref 0.61–1.24)
Chloride: 103 mmol/L (ref 101–111)
GFR, EST AFRICAN AMERICAN: 16 mL/min — AB (ref >60)
GFR, EST NON AFRICAN AMERICAN: 14 mL/min — AB (ref >60)
Glucose, Bld: 157 mg/dL — ABNORMAL HIGH (ref 65–99)
Potassium: 4.3 mmol/L (ref 3.5–5.1)
Sodium: 136 mmol/L (ref 135–145)
Total Protein: 7.5 g/dL (ref 6.5–8.1)

## 2016-07-07 LAB — CBC
HEMATOCRIT: 40.4 % (ref 40.0–52.0)
Hemoglobin: 13.2 g/dL (ref 13.0–18.0)
MCH: 25.6 pg — ABNORMAL LOW (ref 26.0–34.0)
MCHC: 32.6 g/dL (ref 32.0–36.0)
MCV: 78.6 fL — AB (ref 80.0–100.0)
Platelets: 199 10*3/uL (ref 150–440)
RBC: 5.14 MIL/uL (ref 4.40–5.90)
RDW: 18 % — AB (ref 11.5–14.5)
WBC: 7.9 10*3/uL (ref 3.8–10.6)

## 2016-07-07 LAB — TROPONIN I: Troponin I: <0.03 ng/mL (ref <0.03)

## 2016-07-07 LAB — PROTIME-INR
INR: 1.04
Prothrombin Time: 13.6 seconds (ref 11.4–15.2)

## 2016-07-07 LAB — APTT: aPTT: 28 seconds (ref 24–36)

## 2016-07-07 MED ORDER — LABETALOL HCL 5 MG/ML IV SOLN
10.0000 mg | Freq: Once | INTRAVENOUS | Status: AC
  Administered 2016-07-07: 10 mg via INTRAVENOUS
  Filled 2016-07-07: qty 4

## 2016-07-07 MED ORDER — ASPIRIN 81 MG PO CHEW: 324.0000 mg | CHEWABLE_TABLET | Freq: Once | ORAL | Status: DC

## 2016-07-07 MED ORDER — NITROGLYCERIN 0.4 MG SL SUBL: 0.4000 mg | SUBLINGUAL_TABLET | SUBLINGUAL | Status: DC | PRN

## 2016-07-07 MED ORDER — SODIUM CHLORIDE 0.9 % IV SOLN
10.0000 mL/h | INTRAVENOUS | Status: DC
  Administered 2016-07-07: 20 mL/h via INTRAVENOUS

## 2016-07-07 MED ORDER — METOPROLOL TARTRATE 25 MG PO TABS
25.0000 mg | ORAL_TABLET | Freq: Once | ORAL | Status: AC
  Administered 2016-07-07: 25 mg via ORAL
  Filled 2016-07-07: qty 1

## 2016-07-07 MED ORDER — FENTANYL CITRATE (PF) 100 MCG/2ML IJ SOLN: 100.0000 ug | INTRAMUSCULAR | Status: DC | PRN

## 2016-07-07 MED ORDER — LABETALOL HCL 5 MG/ML IV SOLN
5.0000 mg | Freq: Once | INTRAVENOUS | Status: AC
  Administered 2016-07-07: 5 mg via INTRAVENOUS
  Filled 2016-07-07: qty 4

## 2016-07-07 MED ORDER — DIPHENHYDRAMINE HCL 50 MG/ML IJ SOLN
12.5000 mg | Freq: Once | INTRAMUSCULAR | Status: AC
  Administered 2016-07-07: 12.5 mg via INTRAVENOUS
  Filled 2016-07-07: qty 1

## 2016-07-07 MED ORDER — METOCLOPRAMIDE HCL 5 MG/ML IJ SOLN
10.0000 mg | Freq: Once | INTRAMUSCULAR | Status: AC
  Administered 2016-07-07: 10 mg via INTRAVENOUS
  Filled 2016-07-07: qty 2
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
## 2016-07-07 NOTE — ED Triage Notes (Signed)
Pt brought in by [facility] after CP began about 1500 today.  Pt reports chest pain level 4/10 upon arrival.  Pt given 50 mcg fentanyl, 325 mg aspirin, and 1 Nitro sublingual tablet in EMS.  Pt A&O.

## 2016-12-16 ENCOUNTER — Other Ambulatory Visit
Admission: RE | Admit: 2016-12-16 | Discharge: 2016-12-16 | Disposition: A | Discharge status: Home / Self Care | Payer: Medicare Other | Source: Ambulatory Visit | Attending: Surgery | Admitting: Surgery

## 2016-12-16 DIAGNOSIS — M868X7 Other osteomyelitis, ankle and foot: Secondary | ICD-10-CM | POA: Diagnosis present

## 2016-12-16 LAB — CREATININE, SERUM
CREATININE: 2.86 mg/dL — AB (ref 0.61–1.24)
GFR calc Af Amer: 27 mL/min — ABNORMAL LOW (ref >60)
GFR calc non Af Amer: 23 mL/min — ABNORMAL LOW (ref >60)

## 2016-12-16 LAB — ALT: ALT: 20 U/L (ref 17–63)

## 2016-12-16 LAB — CBC WITH DIFFERENTIAL/PLATELET
BASOS PCT: 1 %
Basophils Absolute: 0.1 10*3/uL (ref 0–0.1)
EOS ABS: 0.2 10*3/uL (ref 0–0.7)
EOS PCT: 2 %
HCT: 35 % — ABNORMAL LOW (ref 40.0–52.0)
Hemoglobin: 11.1 g/dL — ABNORMAL LOW (ref 13.0–18.0)
LYMPHS ABS: 2.3 10*3/uL (ref 1.0–3.6)
Lymphocytes Relative: 22 %
MCH: 25.7 pg — AB (ref 26.0–34.0)
MCHC: 31.7 g/dL — ABNORMAL LOW (ref 32.0–36.0)
MCV: 81.3 fL (ref 80.0–100.0)
Monocytes Absolute: 0.8 10*3/uL (ref 0.2–1.0)
Monocytes Relative: 8 %
Neutro Abs: 6.9 10*3/uL — ABNORMAL HIGH (ref 1.4–6.5)
Neutrophils Relative %: 67 %
PLATELETS: 379 10*3/uL (ref 150–440)
RBC: 4.3 MIL/uL — AB (ref 4.40–5.90)
RDW: 21.7 % — ABNORMAL HIGH (ref 11.5–14.5)
WBC: 10.3 10*3/uL (ref 3.8–10.6)

## 2016-12-16 LAB — BUN: BUN: 37 mg/dL — ABNORMAL HIGH (ref 6–20)

## 2016-12-16 LAB — AST: AST: 28 U/L (ref 15–41)

## 2016-12-22 ENCOUNTER — Other Ambulatory Visit
Admission: RE | Admit: 2016-12-22 | Discharge: 2016-12-22 | Disposition: A | Discharge status: Home / Self Care | Payer: Medicare Other | Source: Ambulatory Visit | Attending: Surgery | Admitting: Surgery

## 2016-12-22 DIAGNOSIS — M869 Osteomyelitis, unspecified: Secondary | ICD-10-CM | POA: Insufficient documentation

## 2016-12-22 LAB — CBC WITH DIFFERENTIAL/PLATELET
Basophils Absolute: 0 10*3/uL (ref 0–0.1)
Basophils Relative: 1 %
EOS ABS: 0.3 10*3/uL (ref 0–0.7)
Eosinophils Relative: 3 %
HCT: 37.5 % — ABNORMAL LOW (ref 40.0–52.0)
Hemoglobin: 12.1 g/dL — ABNORMAL LOW (ref 13.0–18.0)
LYMPHS ABS: 2.3 10*3/uL (ref 1.0–3.6)
LYMPHS PCT: 23 %
MCH: 26.1 pg (ref 26.0–34.0)
MCHC: 32.3 g/dL (ref 32.0–36.0)
MCV: 80.7 fL (ref 80.0–100.0)
Monocytes Absolute: 0.7 10*3/uL (ref 0.2–1.0)
Monocytes Relative: 7 %
NEUTROS PCT: 66 %
Neutro Abs: 6.5 10*3/uL (ref 1.4–6.5)
Platelets: 366 10*3/uL (ref 150–440)
RBC: 4.65 MIL/uL (ref 4.40–5.90)
RDW: 21.3 % — ABNORMAL HIGH (ref 11.5–14.5)
WBC: 9.8 10*3/uL (ref 3.8–10.6)

## 2016-12-22 LAB — ALT: ALT: 20 U/L (ref 17–63)

## 2016-12-22 LAB — BUN: BUN: 43 mg/dL — ABNORMAL HIGH (ref 6–20)

## 2016-12-22 LAB — CREATININE, SERUM
Creatinine, Ser: 3.27 mg/dL — ABNORMAL HIGH (ref 0.61–1.24)
GFR calc Af Amer: 23 mL/min — ABNORMAL LOW (ref >60)
GFR calc non Af Amer: 20 mL/min — ABNORMAL LOW (ref >60)

## 2016-12-22 LAB — AST: AST: 24 U/L (ref 15–41)

## 2016-12-22 LAB — CK: CK TOTAL: 201 U/L (ref 49–397)

## 2017-01-05 ENCOUNTER — Other Ambulatory Visit
Admission: RE | Admit: 2017-01-05 | Discharge: 2017-01-05 | Disposition: A | Discharge status: Home / Self Care | Payer: Medicare Other | Source: Other Acute Inpatient Hospital | Attending: Cardiovascular Disease | Admitting: Cardiovascular Disease

## 2017-01-05 DIAGNOSIS — M868X7 Other osteomyelitis, ankle and foot: Secondary | ICD-10-CM | POA: Diagnosis present

## 2017-01-05 LAB — CBC WITH DIFFERENTIAL/PLATELET
BASOS ABS: 0.2 10*3/uL — AB (ref 0–0.1)
BASOS PCT: 2 %
EOS ABS: 0.3 10*3/uL (ref 0–0.7)
EOS PCT: 4 %
HCT: 41.8 % (ref 40.0–52.0)
Hemoglobin: 13.2 g/dL (ref 13.0–18.0)
LYMPHS PCT: 26 %
Lymphs Abs: 1.9 10*3/uL (ref 1.0–3.6)
MCH: 25.4 pg — ABNORMAL LOW (ref 26.0–34.0)
MCHC: 31.7 g/dL — ABNORMAL LOW (ref 32.0–36.0)
MCV: 80 fL (ref 80.0–100.0)
Monocytes Absolute: 0.6 10*3/uL (ref 0.2–1.0)
Monocytes Relative: 8 %
Neutro Abs: 4.6 10*3/uL (ref 1.4–6.5)
Neutrophils Relative %: 60 %
PLATELETS: 305 10*3/uL (ref 150–440)
RBC: 5.22 MIL/uL (ref 4.40–5.90)
RDW: 20.2 % — ABNORMAL HIGH (ref 11.5–14.5)
WBC: 7.6 10*3/uL (ref 3.8–10.6)

## 2017-01-05 LAB — CK: Total CK: 134 U/L (ref 49–397)

## 2017-01-05 LAB — CREATININE, SERUM
CREATININE: 3.29 mg/dL — AB (ref 0.61–1.24)
GFR, EST AFRICAN AMERICAN: 23 mL/min — AB (ref >60)
GFR, EST NON AFRICAN AMERICAN: 20 mL/min — AB (ref >60)

## 2017-01-05 LAB — AST: AST: 33 U/L (ref 15–41)

## 2017-01-05 LAB — BUN: BUN: 53 mg/dL — AB (ref 6–20)

## 2017-01-05 LAB — ALT: ALT: 22 U/L (ref 17–63)

## 2017-02-16 ENCOUNTER — Other Ambulatory Visit
Admission: RE | Admit: 2017-02-16 | Discharge: 2017-02-16 | Disposition: A | Discharge status: Home / Self Care | Payer: Medicare Other | Source: Ambulatory Visit | Attending: Surgery | Admitting: Surgery

## 2017-02-16 DIAGNOSIS — M869 Osteomyelitis, unspecified: Secondary | ICD-10-CM | POA: Diagnosis present

## 2017-02-16 LAB — CBC WITH DIFFERENTIAL/PLATELET
Basophils Absolute: 0.1 10*3/uL (ref 0–0.1)
Basophils Relative: 1 %
Eosinophils Absolute: 0.1 10*3/uL (ref 0–0.7)
Eosinophils Relative: 2 %
HEMATOCRIT: 34.4 % — AB (ref 40.0–52.0)
HEMOGLOBIN: 11.1 g/dL — AB (ref 13.0–18.0)
LYMPHS ABS: 1.4 10*3/uL (ref 1.0–3.6)
LYMPHS PCT: 20 %
MCH: 24.7 pg — AB (ref 26.0–34.0)
MCHC: 32.3 g/dL (ref 32.0–36.0)
MCV: 76.7 fL — ABNORMAL LOW (ref 80.0–100.0)
MONOS PCT: 10 %
Monocytes Absolute: 0.7 10*3/uL (ref 0.2–1.0)
NEUTROS ABS: 4.8 10*3/uL (ref 1.4–6.5)
NEUTROS PCT: 67 %
Platelets: 270 10*3/uL (ref 150–440)
RBC: 4.48 MIL/uL (ref 4.40–5.90)
RDW: 20.3 % — ABNORMAL HIGH (ref 11.5–14.5)
WBC: 7.2 10*3/uL (ref 3.8–10.6)

## 2017-02-16 LAB — CREATININE, SERUM
CREATININE: 2.58 mg/dL — AB (ref 0.61–1.24)
GFR calc Af Amer: 31 mL/min — ABNORMAL LOW (ref >60)
GFR calc non Af Amer: 27 mL/min — ABNORMAL LOW (ref >60)

## 2017-02-16 LAB — CK: Total CK: 183 U/L (ref 49–397)

## 2017-02-16 LAB — AST: AST: 21 U/L (ref 15–41)

## 2017-02-16 LAB — BUN: BUN: 48 mg/dL — ABNORMAL HIGH (ref 6–20)

## 2017-02-16 LAB — ALT: ALT: 19 U/L (ref 17–63)

## 2017-04-24 ENCOUNTER — Other Ambulatory Visit
Admission: RE | Admit: 2017-04-24 | Discharge: 2017-04-24 | Disposition: A | Discharge status: Home / Self Care | Payer: Medicare Other | Source: Ambulatory Visit | Attending: Surgery | Admitting: Surgery

## 2017-04-24 DIAGNOSIS — M869 Osteomyelitis, unspecified: Secondary | ICD-10-CM | POA: Diagnosis present

## 2017-04-24 LAB — CBC WITH DIFFERENTIAL/PLATELET
Basophils Absolute: 0.1 10*3/uL (ref 0–0.1)
Basophils Relative: 1 %
EOS ABS: 0.1 10*3/uL (ref 0–0.7)
Eosinophils Relative: 2 %
HEMATOCRIT: 37 % — AB (ref 40.0–52.0)
HEMOGLOBIN: 11.9 g/dL — AB (ref 13.0–18.0)
LYMPHS ABS: 1.4 10*3/uL (ref 1.0–3.6)
LYMPHS PCT: 18 %
MCH: 24.7 pg — AB (ref 26.0–34.0)
MCHC: 32.2 g/dL (ref 32.0–36.0)
MCV: 76.8 fL — ABNORMAL LOW (ref 80.0–100.0)
MONOS PCT: 10 %
Monocytes Absolute: 0.7 10*3/uL (ref 0.2–1.0)
NEUTROS ABS: 5.1 10*3/uL (ref 1.4–6.5)
NEUTROS PCT: 69 %
Platelets: 264 10*3/uL (ref 150–440)
RBC: 4.82 MIL/uL (ref 4.40–5.90)
RDW: 22.7 % — ABNORMAL HIGH (ref 11.5–14.5)
WBC: 7.4 10*3/uL (ref 3.8–10.6)

## 2017-04-24 LAB — CREATININE, SERUM
Creatinine, Ser: 3.08 mg/dL — ABNORMAL HIGH (ref 0.61–1.24)
GFR, EST AFRICAN AMERICAN: 25 mL/min — AB (ref >60)
GFR, EST NON AFRICAN AMERICAN: 21 mL/min — AB (ref >60)

## 2017-04-24 LAB — AST: AST: 23 U/L (ref 15–41)

## 2017-04-24 LAB — VANCOMYCIN, TROUGH: VANCOMYCIN TR: 35 ug/mL — AB (ref 15–20)

## 2017-04-24 LAB — CK: Total CK: 140 U/L (ref 49–397)

## 2017-04-24 LAB — BUN: BUN: 39 mg/dL — AB (ref 6–20)

## 2017-04-24 LAB — ALT: ALT: 19 U/L (ref 17–63)

## 2017-07-01 ENCOUNTER — Other Ambulatory Visit
Admission: RE | Admit: 2017-07-01 | Discharge: 2017-07-01 | Disposition: A | Discharge status: Home / Self Care | Payer: Medicare Other | Source: Ambulatory Visit | Attending: Internal Medicine | Admitting: Internal Medicine

## 2017-07-01 DIAGNOSIS — E1169 Type 2 diabetes mellitus with other specified complication: Secondary | ICD-10-CM | POA: Diagnosis present

## 2017-07-01 LAB — CBC WITH DIFFERENTIAL/PLATELET
Basophils Absolute: 0 10*3/uL (ref 0–0.1)
Basophils Relative: 1 %
EOS PCT: 3 %
Eosinophils Absolute: 0.2 10*3/uL (ref 0–0.7)
HCT: 37 % — ABNORMAL LOW (ref 40.0–52.0)
Hemoglobin: 11.8 g/dL — ABNORMAL LOW (ref 13.0–18.0)
LYMPHS ABS: 1.2 10*3/uL (ref 1.0–3.6)
LYMPHS PCT: 21 %
MCH: 24.8 pg — AB (ref 26.0–34.0)
MCHC: 32 g/dL (ref 32.0–36.0)
MCV: 77.5 fL — AB (ref 80.0–100.0)
MONO ABS: 0.4 10*3/uL (ref 0.2–1.0)
Monocytes Relative: 7 %
Neutro Abs: 3.9 10*3/uL (ref 1.4–6.5)
Neutrophils Relative %: 68 %
PLATELETS: 102 10*3/uL — AB (ref 150–440)
RBC: 4.77 MIL/uL (ref 4.40–5.90)
RDW: 19.4 % — AB (ref 11.5–14.5)
WBC: 5.7 10*3/uL (ref 3.8–10.6)

## 2017-07-01 LAB — C-REACTIVE PROTEIN: CRP: <0.8 mg/dL (ref <1.0)

## 2017-07-01 LAB — SEDIMENTATION RATE: Sed Rate: 41 mm/hr — ABNORMAL HIGH (ref 0–20)

## 2017-07-01 LAB — BUN: BUN: 39 mg/dL — ABNORMAL HIGH (ref 6–20)

## 2017-07-01 LAB — AST: AST: 21 U/L (ref 15–41)

## 2017-07-01 LAB — ALT: ALT: 18 U/L (ref 17–63)

## 2017-07-01 LAB — CREATININE, SERUM
CREATININE: 2.66 mg/dL — AB (ref 0.61–1.24)
GFR calc Af Amer: 30 mL/min — ABNORMAL LOW (ref >60)
GFR, EST NON AFRICAN AMERICAN: 26 mL/min — AB (ref >60)

## 2017-07-01 LAB — CK: CK TOTAL: 211 U/L (ref 49–397)

## 2017-07-14 ENCOUNTER — Other Ambulatory Visit
Admission: RE | Admit: 2017-07-14 | Discharge: 2017-07-14 | Disposition: A | Discharge status: Home / Self Care | Payer: Medicare Other | Source: Ambulatory Visit | Attending: Podiatry | Admitting: Podiatry

## 2017-07-14 DIAGNOSIS — M86471 Chronic osteomyelitis with draining sinus, right ankle and foot: Secondary | ICD-10-CM | POA: Diagnosis present

## 2017-07-14 LAB — CBC WITH DIFFERENTIAL/PLATELET
Basophils Absolute: 0.1 10*3/uL (ref 0–0.1)
Basophils Relative: 1 %
Eosinophils Absolute: 0.2 10*3/uL (ref 0–0.7)
Eosinophils Relative: 2 %
HEMATOCRIT: 34.6 % — AB (ref 40.0–52.0)
HEMOGLOBIN: 11.1 g/dL — AB (ref 13.0–18.0)
LYMPHS ABS: 1.5 10*3/uL (ref 1.0–3.6)
Lymphocytes Relative: 21 %
MCH: 24.9 pg — AB (ref 26.0–34.0)
MCHC: 32.1 g/dL (ref 32.0–36.0)
MCV: 77.7 fL — ABNORMAL LOW (ref 80.0–100.0)
MONOS PCT: 9 %
Monocytes Absolute: 0.6 10*3/uL (ref 0.2–1.0)
NEUTROS ABS: 4.7 10*3/uL (ref 1.4–6.5)
NEUTROS PCT: 67 %
PLATELETS: 283 10*3/uL (ref 150–440)
RBC: 4.45 MIL/uL (ref 4.40–5.90)
RDW: 19.5 % — ABNORMAL HIGH (ref 11.5–14.5)
WBC: 7 10*3/uL (ref 3.8–10.6)

## 2017-07-17 ENCOUNTER — Other Ambulatory Visit
Admission: RE | Admit: 2017-07-17 | Discharge: 2017-07-17 | Disposition: A | Discharge status: Home / Self Care | Payer: Medicare Other | Source: Ambulatory Visit | Attending: Internal Medicine | Admitting: Internal Medicine

## 2017-07-17 DIAGNOSIS — E875 Hyperkalemia: Secondary | ICD-10-CM | POA: Diagnosis present

## 2017-07-17 LAB — POTASSIUM: Potassium: 5 mmol/L (ref 3.5–5.1)

## 2017-08-28 ENCOUNTER — Emergency Department
Admission: EM | Admit: 2017-08-28 | Discharge: 2017-08-28 | Disposition: A | Discharge status: Home / Self Care | Payer: Medicare Other | Attending: Emergency Medicine | Admitting: Emergency Medicine

## 2017-08-28 ENCOUNTER — Emergency Department: Payer: Medicare Other

## 2017-08-28 DIAGNOSIS — E860 Dehydration: Secondary | ICD-10-CM | POA: Diagnosis not present

## 2017-08-28 DIAGNOSIS — R110 Nausea: Secondary | ICD-10-CM | POA: Insufficient documentation

## 2017-08-28 DIAGNOSIS — R109 Unspecified abdominal pain: Secondary | ICD-10-CM | POA: Diagnosis present

## 2017-08-28 DIAGNOSIS — R1013 Epigastric pain: Secondary | ICD-10-CM

## 2017-08-28 DIAGNOSIS — E119 Type 2 diabetes mellitus without complications: Secondary | ICD-10-CM | POA: Insufficient documentation

## 2017-08-28 DIAGNOSIS — Z87891 Personal history of nicotine dependence: Secondary | ICD-10-CM | POA: Insufficient documentation

## 2017-08-28 DIAGNOSIS — I25810 Atherosclerosis of coronary artery bypass graft(s) without angina pectoris: Secondary | ICD-10-CM | POA: Diagnosis not present

## 2017-08-28 LAB — URINALYSIS, COMPLETE (UACMP) WITH MICROSCOPIC
Bacteria, UA: NONE SEEN
Bilirubin Urine: NEGATIVE
Glucose, UA: >=500 mg/dL — AB
KETONES UR: NEGATIVE mg/dL
Leukocytes, UA: NEGATIVE
Nitrite: NEGATIVE
PH: 5 (ref 5.0–8.0)
Protein, ur: 100 mg/dL — AB
SPECIFIC GRAVITY, URINE: 1.007 (ref 1.005–1.030)

## 2017-08-28 LAB — TYPE AND SCREEN
ABO/RH(D): A POS
Antibody Screen: NEGATIVE

## 2017-08-28 LAB — COMPREHENSIVE METABOLIC PANEL
ALBUMIN: 3.6 g/dL (ref 3.5–5.0)
ALK PHOS: 140 U/L — AB (ref 38–126)
ALT: 27 U/L (ref 17–63)
AST: 37 U/L (ref 15–41)
Anion gap: 17 — ABNORMAL HIGH (ref 5–15)
BUN: 72 mg/dL — ABNORMAL HIGH (ref 6–20)
CALCIUM: 9.2 mg/dL (ref 8.9–10.3)
CO2: 25 mmol/L (ref 22–32)
CREATININE: 4.17 mg/dL — AB (ref 0.61–1.24)
Chloride: 92 mmol/L — ABNORMAL LOW (ref 101–111)
GFR calc Af Amer: 17 mL/min — ABNORMAL LOW (ref >60)
GFR calc non Af Amer: 15 mL/min — ABNORMAL LOW (ref >60)
GLUCOSE: 258 mg/dL — AB (ref 65–99)
Potassium: 3.3 mmol/L — ABNORMAL LOW (ref 3.5–5.1)
SODIUM: 134 mmol/L — AB (ref 135–145)
Total Bilirubin: 1.2 mg/dL (ref 0.3–1.2)
Total Protein: 8.7 g/dL — ABNORMAL HIGH (ref 6.5–8.1)

## 2017-08-28 LAB — CBC
HCT: 43.3 % (ref 40.0–52.0)
Hemoglobin: 14.3 g/dL (ref 13.0–18.0)
MCH: 24.3 pg — AB (ref 26.0–34.0)
MCHC: 33 g/dL (ref 32.0–36.0)
MCV: 73.6 fL — ABNORMAL LOW (ref 80.0–100.0)
PLATELETS: 293 10*3/uL (ref 150–440)
RBC: 5.88 MIL/uL (ref 4.40–5.90)
RDW: 21.5 % — ABNORMAL HIGH (ref 11.5–14.5)
WBC: 10.3 10*3/uL (ref 3.8–10.6)

## 2017-08-28 LAB — LIPASE, BLOOD: Lipase: 91 U/L — ABNORMAL HIGH (ref 11–51)

## 2017-08-28 LAB — MAGNESIUM: MAGNESIUM: 1.9 mg/dL (ref 1.7–2.4)

## 2017-08-28 MED ORDER — ONDANSETRON HCL 4 MG/2ML IJ SOLN
4.0000 mg | Freq: Once | INTRAMUSCULAR | Status: AC
  Administered 2017-08-28: 4 mg via INTRAVENOUS
  Filled 2017-08-28: qty 2

## 2017-08-28 MED ORDER — FENTANYL CITRATE (PF) 100 MCG/2ML IJ SOLN
50.0000 ug | Freq: Once | INTRAMUSCULAR | Status: AC
  Administered 2017-08-28: 50 ug via INTRAVENOUS
  Filled 2017-08-28: qty 2

## 2017-08-28 MED ORDER — IOPAMIDOL (ISOVUE-300) INJECTION 61%
15.0000 mL | INTRAVENOUS | Status: AC
  Administered 2017-08-28: 15 mL via ORAL

## 2017-08-28 MED ORDER — SODIUM CHLORIDE 0.9 % IV BOLUS (SEPSIS)
1000.0000 mL | Freq: Once | INTRAVENOUS | Status: AC
  Administered 2017-08-28: 1000 mL via INTRAVENOUS

## 2017-08-28 MED ORDER — ONDANSETRON 4 MG PO TBDP: 4.0000 mg | ORAL_TABLET | Freq: Three times a day (TID) | ORAL | 0 refills | Status: AC | PRN

## 2017-08-28 MED ORDER — OXYCODONE-ACETAMINOPHEN 5-325 MG PO TABS: 1.0000 | ORAL_TABLET | Freq: Four times a day (QID) | ORAL | 0 refills | Status: DC | PRN

## 2017-08-28 NOTE — ED Notes (Signed)
Pt. Returned to tx. room in stable condition with no acute changes since departure from unit for scans.   

## 2017-08-28 NOTE — ED Triage Notes (Signed)
Pt brought in via [facility] from [facility] for possible dehydration and abdominal pain.  Pt dx with MRSA and Cdiff from [facility] 2 weeks ago and was sent to to [facility] to receive IV abx.  Pt A&Ox4 at this time.  Per nurse report to EMS, pt also having blood in stools.

## 2017-08-28 NOTE — ED Notes (Signed)
Per ED charge, PICC line must have placement verified by Xray.  EDP give VO for this.  Pt difficult stick for IV, attempted x 2 with no IV established.  Able to retrieve blood peripherally.

## 2017-08-28 NOTE — Discharge Instructions (Signed)
as we discussed your workup shows a possible mild pancreatitis. Please drink plenty of fluids of the next several days, please take your pain medication as needed and nausea medication as needed. Return to the emergency department for any increased abdominal pain, fever, or if you are unable to keep down an adequate amount of fluids due to nausea or vomiting.

## 2017-08-28 NOTE — ED Notes (Signed)
Pt transported to CT ?
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
## 2017-08-28 NOTE — ED Notes (Signed)
Patient is resting comfortably at this time with no signs of distress present. VS stable. Will continue to monitor.   

## 2017-08-28 NOTE — ED Notes (Signed)
Tried to call [facility], unable to get a hold of anyone to update on pts status of returning back.

## 2017-08-31 ENCOUNTER — Other Ambulatory Visit
Admission: RE | Admit: 2017-08-31 | Discharge: 2017-08-31 | Disposition: A | Discharge status: Home / Self Care | Payer: Medicare Other | Source: Ambulatory Visit | Attending: Family Medicine | Admitting: Family Medicine

## 2017-08-31 DIAGNOSIS — M8618 Other acute osteomyelitis, other site: Secondary | ICD-10-CM | POA: Diagnosis present

## 2017-08-31 LAB — CREATININE, SERUM
CREATININE: 3.85 mg/dL — AB (ref 0.61–1.24)
GFR calc Af Amer: 19 mL/min — ABNORMAL LOW (ref >60)
GFR calc non Af Amer: 16 mL/min — ABNORMAL LOW (ref >60)

## 2017-09-02 ENCOUNTER — Other Ambulatory Visit
Admission: RE | Admit: 2017-09-02 | Discharge: 2017-09-02 | Disposition: A | Discharge status: Home / Self Care | Payer: Medicare Other | Source: Ambulatory Visit | Attending: Family Medicine | Admitting: Family Medicine

## 2017-09-02 DIAGNOSIS — M869 Osteomyelitis, unspecified: Secondary | ICD-10-CM | POA: Insufficient documentation

## 2017-09-02 LAB — CREATININE, SERUM
Creatinine, Ser: 4.66 mg/dL — ABNORMAL HIGH (ref 0.61–1.24)
GFR calc Af Amer: 15 mL/min — ABNORMAL LOW (ref >60)
GFR calc non Af Amer: 13 mL/min — ABNORMAL LOW (ref >60)

## 2017-09-02 LAB — VANCOMYCIN, TROUGH: Vancomycin Tr: 34 ug/mL (ref 15–20)

## 2017-09-03 ENCOUNTER — Other Ambulatory Visit
Admission: RE | Admit: 2017-09-03 | Discharge: 2017-09-03 | Disposition: A | Discharge status: Home / Self Care | Payer: Medicare Other | Source: Ambulatory Visit | Attending: Family Medicine | Admitting: Family Medicine

## 2017-09-03 ENCOUNTER — Other Ambulatory Visit: Admission: RE | Admit: 2017-09-03 | Payer: Self-pay | Source: Ambulatory Visit | Admitting: *Deleted

## 2017-09-03 DIAGNOSIS — M8618 Other acute osteomyelitis, other site: Secondary | ICD-10-CM | POA: Insufficient documentation

## 2017-09-03 LAB — CREATININE, SERUM
CREATININE: 5.58 mg/dL — AB (ref 0.61–1.24)
GFR calc non Af Amer: 10 mL/min — ABNORMAL LOW (ref >60)
GFR, EST AFRICAN AMERICAN: 12 mL/min — AB (ref >60)

## 2017-09-03 LAB — VANCOMYCIN, TROUGH: VANCOMYCIN TR: 39 ug/mL — AB (ref 15–20)
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
## 2017-12-21 ENCOUNTER — Emergency Department
Admission: EM | Admit: 2017-12-21 | Discharge: 2017-12-21 | Disposition: A | Discharge status: Home / Self Care | Payer: Medicare Other | Attending: Emergency Medicine | Admitting: Emergency Medicine

## 2017-12-21 ENCOUNTER — Encounter: Payer: Self-pay | Admitting: Emergency Medicine

## 2017-12-21 ENCOUNTER — Other Ambulatory Visit: Payer: Self-pay

## 2017-12-21 DIAGNOSIS — N184 Chronic kidney disease, stage 4 (severe): Secondary | ICD-10-CM | POA: Insufficient documentation

## 2017-12-21 DIAGNOSIS — E1122 Type 2 diabetes mellitus with diabetic chronic kidney disease: Secondary | ICD-10-CM | POA: Diagnosis not present

## 2017-12-21 DIAGNOSIS — R197 Diarrhea, unspecified: Secondary | ICD-10-CM | POA: Insufficient documentation

## 2017-12-21 DIAGNOSIS — I4891 Unspecified atrial fibrillation: Secondary | ICD-10-CM | POA: Diagnosis not present

## 2017-12-21 DIAGNOSIS — Z87891 Personal history of nicotine dependence: Secondary | ICD-10-CM | POA: Diagnosis not present

## 2017-12-21 DIAGNOSIS — R109 Unspecified abdominal pain: Secondary | ICD-10-CM | POA: Diagnosis present

## 2017-12-21 DIAGNOSIS — I2510 Atherosclerotic heart disease of native coronary artery without angina pectoris: Secondary | ICD-10-CM | POA: Diagnosis not present

## 2017-12-21 DIAGNOSIS — R42 Dizziness and giddiness: Secondary | ICD-10-CM | POA: Diagnosis not present

## 2017-12-21 LAB — URINALYSIS, COMPLETE (UACMP) WITH MICROSCOPIC
BILIRUBIN URINE: NEGATIVE
Bacteria, UA: NONE SEEN
Glucose, UA: NEGATIVE mg/dL
KETONES UR: NEGATIVE mg/dL
NITRITE: NEGATIVE
PROTEIN: 30 mg/dL — AB
Specific Gravity, Urine: 1.006 (ref 1.005–1.030)
pH: 6 (ref 5.0–8.0)

## 2017-12-21 LAB — COMPREHENSIVE METABOLIC PANEL
ALBUMIN: 2.8 g/dL — AB (ref 3.5–5.0)
ALK PHOS: 130 U/L — AB (ref 38–126)
ALT: 20 U/L (ref 17–63)
ANION GAP: 11 (ref 5–15)
AST: 31 U/L (ref 15–41)
BUN: 27 mg/dL — ABNORMAL HIGH (ref 6–20)
CHLORIDE: 100 mmol/L — AB (ref 101–111)
CO2: 25 mmol/L (ref 22–32)
Calcium: 8.6 mg/dL — ABNORMAL LOW (ref 8.9–10.3)
Creatinine, Ser: 2.63 mg/dL — ABNORMAL HIGH (ref 0.61–1.24)
GFR calc non Af Amer: 26 mL/min — ABNORMAL LOW (ref >60)
GFR, EST AFRICAN AMERICAN: 30 mL/min — AB (ref >60)
GLUCOSE: 99 mg/dL (ref 65–99)
Potassium: 3.6 mmol/L (ref 3.5–5.1)
SODIUM: 136 mmol/L (ref 135–145)
Total Bilirubin: 0.9 mg/dL (ref 0.3–1.2)
Total Protein: 6.4 g/dL — ABNORMAL LOW (ref 6.5–8.1)

## 2017-12-21 LAB — CBC WITH DIFFERENTIAL/PLATELET
BASOS PCT: 1 %
Basophils Absolute: 0 10*3/uL (ref 0–0.1)
EOS ABS: 0.1 10*3/uL (ref 0–0.7)
EOS PCT: 1 %
HCT: 37.3 % — ABNORMAL LOW (ref 40.0–52.0)
HEMOGLOBIN: 12.2 g/dL — AB (ref 13.0–18.0)
LYMPHS ABS: 2.1 10*3/uL (ref 1.0–3.6)
Lymphocytes Relative: 29 %
MCH: 25.9 pg — AB (ref 26.0–34.0)
MCHC: 32.7 g/dL (ref 32.0–36.0)
MCV: 79.2 fL — ABNORMAL LOW (ref 80.0–100.0)
MONOS PCT: 9 %
Monocytes Absolute: 0.7 10*3/uL (ref 0.2–1.0)
NEUTROS PCT: 60 %
Neutro Abs: 4.3 10*3/uL (ref 1.4–6.5)
PLATELETS: 274 10*3/uL (ref 150–440)
RBC: 4.71 MIL/uL (ref 4.40–5.90)
RDW: 16.7 % — ABNORMAL HIGH (ref 11.5–14.5)
WBC: 7.3 10*3/uL (ref 3.8–10.6)

## 2017-12-21 LAB — TROPONIN I: Troponin I: <0.03 ng/mL (ref <0.03)

## 2017-12-21 MED ORDER — SODIUM CHLORIDE 0.9 % IV SOLN: Freq: Once | INTRAVENOUS | Status: DC

## 2017-12-21 MED ORDER — SODIUM CHLORIDE 0.9 % IV SOLN
Freq: Once | INTRAVENOUS | Status: AC
Start: 2017-12-21 — End: 2017-12-21
  Administered 2017-12-21: 15:00:00 via INTRAVENOUS

## 2017-12-21 NOTE — ED Provider Notes (Signed)
-----------------------------------------   5:10 PM on 12/21/2017 -----------------------------------------  Per signout, pt ready for d.c however wanted to see Education officer, museum. Did see social worker, declined all their interventions at this time. Will 'think about it.'  Given that he was medically cleared pt my arrival we will per provider plan. Return precautions and f/u given and understood.    Schuyler Amor, MD 12/21/17 [phone]

## 2017-12-21 NOTE — ED Triage Notes (Signed)
Abdominal pain began this am. Diarrhea x 2 days.
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
## 2017-12-21 NOTE — Clinical Social Work Note (Signed)
CSW received a consult for "discuss placement." CSW from home with assistance from a niece. Pt was recently at [facility] for short term rehab, but was recently discharged home. CSW discussed with pt [facility] (SNF) placement. CSW explained that pt would be able to keep $30-$60 of his SSA/SSDI check and Medicaid would cover the additional cost of stay. Pt stated he is no longer interested in SNF, as he has a mortgage to pay. CSW also discussed/explained the PACE program and offered to make a referral for pt. Pt stated he did not want CSW to make a referral for PACE, but he would "think it over." CSW provided PACE handout and provided contact information for PACE if wanting to make a referral for himself. Pt expressed understanding. CSW updated EDP. CSW signing off as no further Social Work needs identified.   Oretha Ellis, Latanya Presser, [place] Social Worker-ED [phone]

## 2017-12-21 NOTE — ED Notes (Signed)
Family friend requesting her and patients nieces number to be left  Rodena Piety [phone]  Niece Ladell Heads) [phone]

## 2018-04-09 ENCOUNTER — Emergency Department: Payer: Medicare Other

## 2018-04-09 ENCOUNTER — Inpatient Hospital Stay
Admission: EM | Admit: 2018-04-09 | Discharge: 2018-04-12 | DRG: 291 | Disposition: A | Discharge status: Home / Self Care | Payer: Medicare Other | Attending: Internal Medicine | Admitting: Internal Medicine

## 2018-04-09 DIAGNOSIS — E119 Type 2 diabetes mellitus without complications: Secondary | ICD-10-CM

## 2018-04-09 DIAGNOSIS — J449 Chronic obstructive pulmonary disease, unspecified: Secondary | ICD-10-CM | POA: Diagnosis present

## 2018-04-09 DIAGNOSIS — E11319 Type 2 diabetes mellitus with unspecified diabetic retinopathy without macular edema: Secondary | ICD-10-CM | POA: Diagnosis present

## 2018-04-09 DIAGNOSIS — Z7951 Long term (current) use of inhaled steroids: Secondary | ICD-10-CM

## 2018-04-09 DIAGNOSIS — N400 Enlarged prostate without lower urinary tract symptoms: Secondary | ICD-10-CM | POA: Diagnosis present

## 2018-04-09 DIAGNOSIS — Z951 Presence of aortocoronary bypass graft: Secondary | ICD-10-CM

## 2018-04-09 DIAGNOSIS — D631 Anemia in chronic kidney disease: Secondary | ICD-10-CM | POA: Diagnosis present

## 2018-04-09 DIAGNOSIS — E1122 Type 2 diabetes mellitus with diabetic chronic kidney disease: Secondary | ICD-10-CM | POA: Diagnosis present

## 2018-04-09 DIAGNOSIS — I482 Chronic atrial fibrillation, unspecified: Secondary | ICD-10-CM | POA: Diagnosis present

## 2018-04-09 DIAGNOSIS — N2581 Secondary hyperparathyroidism of renal origin: Secondary | ICD-10-CM | POA: Diagnosis present

## 2018-04-09 DIAGNOSIS — I5041 Acute combined systolic (congestive) and diastolic (congestive) heart failure: Secondary | ICD-10-CM | POA: Diagnosis present

## 2018-04-09 DIAGNOSIS — Z87891 Personal history of nicotine dependence: Secondary | ICD-10-CM | POA: Diagnosis not present

## 2018-04-09 DIAGNOSIS — Z79899 Other long term (current) drug therapy: Secondary | ICD-10-CM | POA: Diagnosis not present

## 2018-04-09 DIAGNOSIS — E11621 Type 2 diabetes mellitus with foot ulcer: Secondary | ICD-10-CM | POA: Diagnosis present

## 2018-04-09 DIAGNOSIS — I739 Peripheral vascular disease, unspecified: Secondary | ICD-10-CM

## 2018-04-09 DIAGNOSIS — E785 Hyperlipidemia, unspecified: Secondary | ICD-10-CM | POA: Diagnosis present

## 2018-04-09 DIAGNOSIS — K219 Gastro-esophageal reflux disease without esophagitis: Secondary | ICD-10-CM | POA: Diagnosis present

## 2018-04-09 DIAGNOSIS — R0600 Dyspnea, unspecified: Secondary | ICD-10-CM

## 2018-04-09 DIAGNOSIS — R079 Chest pain, unspecified: Secondary | ICD-10-CM

## 2018-04-09 DIAGNOSIS — L97519 Non-pressure chronic ulcer of other part of right foot with unspecified severity: Secondary | ICD-10-CM | POA: Diagnosis present

## 2018-04-09 DIAGNOSIS — N184 Chronic kidney disease, stage 4 (severe): Secondary | ICD-10-CM | POA: Diagnosis present

## 2018-04-09 DIAGNOSIS — Z7901 Long term (current) use of anticoagulants: Secondary | ICD-10-CM

## 2018-04-09 DIAGNOSIS — I130 Hypertensive heart and chronic kidney disease with heart failure and stage 1 through stage 4 chronic kidney disease, or unspecified chronic kidney disease: Principal | ICD-10-CM | POA: Diagnosis present

## 2018-04-09 DIAGNOSIS — N179 Acute kidney failure, unspecified: Secondary | ICD-10-CM | POA: Diagnosis present

## 2018-04-09 DIAGNOSIS — I2510 Atherosclerotic heart disease of native coronary artery without angina pectoris: Secondary | ICD-10-CM | POA: Diagnosis present

## 2018-04-09 DIAGNOSIS — I25119 Atherosclerotic heart disease of native coronary artery with unspecified angina pectoris: Secondary | ICD-10-CM | POA: Diagnosis present

## 2018-04-09 DIAGNOSIS — Z7982 Long term (current) use of aspirin: Secondary | ICD-10-CM

## 2018-04-09 DIAGNOSIS — Z794 Long term (current) use of insulin: Secondary | ICD-10-CM

## 2018-04-09 DIAGNOSIS — I5021 Acute systolic (congestive) heart failure: Secondary | ICD-10-CM | POA: Diagnosis present

## 2018-04-09 DIAGNOSIS — I5033 Acute on chronic diastolic (congestive) heart failure: Secondary | ICD-10-CM

## 2018-04-09 LAB — CBC
HEMATOCRIT: 31.1 % — AB (ref 40.0–52.0)
HEMOGLOBIN: 9.7 g/dL — AB (ref 13.0–18.0)
MCH: 20.9 pg — ABNORMAL LOW (ref 26.0–34.0)
MCHC: 31.1 g/dL — ABNORMAL LOW (ref 32.0–36.0)
MCV: 67.1 fL — ABNORMAL LOW (ref 80.0–100.0)
Platelets: 202 10*3/uL (ref 150–440)
RBC: 4.63 MIL/uL (ref 4.40–5.90)
RDW: 18.8 % — ABNORMAL HIGH (ref 11.5–14.5)
WBC: 6.4 10*3/uL (ref 3.8–10.6)

## 2018-04-09 LAB — BASIC METABOLIC PANEL
ANION GAP: 10 (ref 5–15)
BUN: 47 mg/dL — ABNORMAL HIGH (ref 6–20)
CO2: 18 mmol/L — ABNORMAL LOW (ref 22–32)
Calcium: 8.5 mg/dL — ABNORMAL LOW (ref 8.9–10.3)
Chloride: 107 mmol/L (ref 101–111)
Creatinine, Ser: 3.68 mg/dL — ABNORMAL HIGH (ref 0.61–1.24)
GFR calc Af Amer: 20 mL/min — ABNORMAL LOW (ref >60)
GFR, EST NON AFRICAN AMERICAN: 17 mL/min — AB (ref >60)
Glucose, Bld: 260 mg/dL — ABNORMAL HIGH (ref 65–99)
POTASSIUM: 4.4 mmol/L (ref 3.5–5.1)
SODIUM: 135 mmol/L (ref 135–145)

## 2018-04-09 LAB — BRAIN NATRIURETIC PEPTIDE: B NATRIURETIC PEPTIDE 5: 277 pg/mL — AB (ref 0.0–100.0)

## 2018-04-09 LAB — TROPONIN I: Troponin I: <0.03 ng/mL (ref <0.03)

## 2018-04-09 MED ORDER — IPRATROPIUM-ALBUTEROL 0.5-2.5 (3) MG/3ML IN SOLN
3.0000 mL | Freq: Once | RESPIRATORY_TRACT | Status: AC
  Administered 2018-04-09: 3 mL via RESPIRATORY_TRACT
  Filled 2018-04-09: qty 3

## 2018-04-09 MED ORDER — MORPHINE SULFATE (PF) 4 MG/ML IV SOLN
4.0000 mg | Freq: Once | INTRAVENOUS | Status: AC
  Administered 2018-04-09: 4 mg via INTRAVENOUS
  Filled 2018-04-09: qty 1

## 2018-04-09 MED ORDER — FUROSEMIDE 10 MG/ML IJ SOLN
60.0000 mg | Freq: Once | INTRAMUSCULAR | Status: AC
  Administered 2018-04-09: 60 mg via INTRAVENOUS
  Filled 2018-04-09: qty 8

## 2018-04-09 MED ORDER — ONDANSETRON HCL 4 MG/2ML IJ SOLN
4.0000 mg | Freq: Once | INTRAMUSCULAR | Status: AC
  Administered 2018-04-09: 4 mg via INTRAVENOUS
  Filled 2018-04-09: qty 2
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
## 2018-04-09 NOTE — ED Notes (Signed)
Patient transported to X-ray 

## 2018-04-09 NOTE — ED Triage Notes (Signed)
Patient coming [facility] from home. patient given 400 mL NaCl in transit. Patient c/o SOB with exhertion and right-sided chest pain. Patient reports accompanying symptoms of lightheadedness, weakness. Patient denies N/V.
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
## 2018-04-10 ENCOUNTER — Other Ambulatory Visit: Payer: Self-pay

## 2018-04-10 ENCOUNTER — Inpatient Hospital Stay (HOSPITAL_COMMUNITY)
Admit: 2018-04-10 | Discharge: 2018-04-10 | Disposition: A | Discharge status: Home / Self Care | Payer: Medicare Other | Attending: Internal Medicine | Admitting: Internal Medicine

## 2018-04-10 DIAGNOSIS — I482 Chronic atrial fibrillation: Secondary | ICD-10-CM

## 2018-04-10 DIAGNOSIS — I5033 Acute on chronic diastolic (congestive) heart failure: Secondary | ICD-10-CM

## 2018-04-10 DIAGNOSIS — I2510 Atherosclerotic heart disease of native coronary artery without angina pectoris: Secondary | ICD-10-CM

## 2018-04-10 LAB — PROTIME-INR
INR: 1.16
PROTHROMBIN TIME: 14.7 s (ref 11.4–15.2)

## 2018-04-10 LAB — TROPONIN I

## 2018-04-10 LAB — ECHOCARDIOGRAM COMPLETE
HEIGHTINCHES: 74 in
Weight: 3476.8 oz

## 2018-04-10 LAB — BASIC METABOLIC PANEL
ANION GAP: 9 (ref 5–15)
BUN: 46 mg/dL — AB (ref 6–20)
CO2: 23 mmol/L (ref 22–32)
Calcium: 8.8 mg/dL — ABNORMAL LOW (ref 8.9–10.3)
Chloride: 106 mmol/L (ref 101–111)
Creatinine, Ser: 4.01 mg/dL — ABNORMAL HIGH (ref 0.61–1.24)
GFR calc Af Amer: 18 mL/min — ABNORMAL LOW (ref >60)
GFR calc non Af Amer: 15 mL/min — ABNORMAL LOW (ref >60)
GLUCOSE: 96 mg/dL (ref 65–99)
POTASSIUM: 3.5 mmol/L (ref 3.5–5.1)
SODIUM: 138 mmol/L (ref 135–145)

## 2018-04-10 LAB — CBC
HEMATOCRIT: 30.8 % — AB (ref 40.0–52.0)
HEMOGLOBIN: 10 g/dL — AB (ref 13.0–18.0)
MCH: 21.4 pg — ABNORMAL LOW (ref 26.0–34.0)
MCHC: 32.3 g/dL (ref 32.0–36.0)
MCV: 66.2 fL — AB (ref 80.0–100.0)
Platelets: 190 10*3/uL (ref 150–440)
RBC: 4.65 MIL/uL (ref 4.40–5.90)
RDW: 18.9 % — AB (ref 11.5–14.5)
WBC: 5.3 10*3/uL (ref 3.8–10.6)

## 2018-04-10 LAB — GLUCOSE, CAPILLARY
GLUCOSE-CAPILLARY: 82 mg/dL (ref 65–99)
Glucose-Capillary: 156 mg/dL — ABNORMAL HIGH (ref 65–99)
Glucose-Capillary: 191 mg/dL — ABNORMAL HIGH (ref 65–99)
Glucose-Capillary: 236 mg/dL — ABNORMAL HIGH (ref 65–99)

## 2018-04-10 MED ORDER — NITROGLYCERIN 0.4 MG SL SUBL: 0.4000 mg | SUBLINGUAL_TABLET | SUBLINGUAL | Status: DC | PRN

## 2018-04-10 MED ORDER — ONDANSETRON HCL 4 MG/2ML IJ SOLN: 4.0000 mg | Freq: Four times a day (QID) | INTRAMUSCULAR | Status: DC | PRN

## 2018-04-10 MED ORDER — FUROSEMIDE 40 MG PO TABS
40.0000 mg | ORAL_TABLET | Freq: Every day | ORAL | Status: DC
  Administered 2018-04-10: 40 mg via ORAL
  Filled 2018-04-10: qty 1

## 2018-04-10 MED ORDER — ENOXAPARIN SODIUM 30 MG/0.3ML ~~LOC~~ SOLN
30.0000 mg | SUBCUTANEOUS | Status: DC
  Administered 2018-04-10: 30 mg via SUBCUTANEOUS
  Filled 2018-04-10: qty 0.3

## 2018-04-10 MED ORDER — WARFARIN - PHARMACIST DOSING INPATIENT: Freq: Every day | Status: DC

## 2018-04-10 MED ORDER — ATORVASTATIN CALCIUM 20 MG PO TABS
40.0000 mg | ORAL_TABLET | Freq: Every evening | ORAL | Status: DC
  Administered 2018-04-10 – 2018-04-11 (×2): 40 mg via ORAL
  Filled 2018-04-10 (×2): qty 2

## 2018-04-10 MED ORDER — OXYCODONE HCL 5 MG PO TABS
5.0000 mg | ORAL_TABLET | ORAL | Status: DC | PRN
  Administered 2018-04-10 – 2018-04-11 (×5): 5 mg via ORAL
  Filled 2018-04-10 (×5): qty 1

## 2018-04-10 MED ORDER — ONDANSETRON HCL 4 MG PO TABS: 4.0000 mg | ORAL_TABLET | Freq: Four times a day (QID) | ORAL | Status: DC | PRN

## 2018-04-10 MED ORDER — PANTOPRAZOLE SODIUM 40 MG PO TBEC
40.0000 mg | DELAYED_RELEASE_TABLET | Freq: Every day | ORAL | Status: DC
  Administered 2018-04-10 – 2018-04-12 (×3): 40 mg via ORAL
  Filled 2018-04-10 (×3): qty 1

## 2018-04-10 MED ORDER — METOPROLOL SUCCINATE ER 100 MG PO TB24
200.0000 mg | ORAL_TABLET | Freq: Every day | ORAL | Status: DC
  Administered 2018-04-10 – 2018-04-12 (×3): 200 mg via ORAL
  Filled 2018-04-10 (×3): qty 2

## 2018-04-10 MED ORDER — MOMETASONE FURO-FORMOTEROL FUM 200-5 MCG/ACT IN AERO
2.0000 | INHALATION_SPRAY | Freq: Two times a day (BID) | RESPIRATORY_TRACT | Status: DC
  Administered 2018-04-10 – 2018-04-12 (×5): 2 via RESPIRATORY_TRACT
  Filled 2018-04-10: qty 8.8

## 2018-04-10 MED ORDER — TAMSULOSIN HCL 0.4 MG PO CAPS
0.4000 mg | ORAL_CAPSULE | Freq: Every day | ORAL | Status: DC
  Administered 2018-04-10 – 2018-04-12 (×3): 0.4 mg via ORAL
  Filled 2018-04-10 (×3): qty 1

## 2018-04-10 MED ORDER — FUROSEMIDE 10 MG/ML IJ SOLN
60.0000 mg | Freq: Two times a day (BID) | INTRAMUSCULAR | Status: DC
  Administered 2018-04-10 – 2018-04-11 (×2): 60 mg via INTRAVENOUS
  Filled 2018-04-10 (×2): qty 6

## 2018-04-10 MED ORDER — ACETAMINOPHEN 650 MG RE SUPP: 650.0000 mg | Freq: Four times a day (QID) | RECTAL | Status: DC | PRN

## 2018-04-10 MED ORDER — ASPIRIN EC 81 MG PO TBEC
81.0000 mg | DELAYED_RELEASE_TABLET | Freq: Every day | ORAL | Status: DC
  Administered 2018-04-10 – 2018-04-12 (×3): 81 mg via ORAL
  Filled 2018-04-10 (×3): qty 1

## 2018-04-10 MED ORDER — PERFLUTREN LIPID MICROSPHERE
1.0000 mL | INTRAVENOUS | Status: AC | PRN
  Administered 2018-04-10: 2 mL via INTRAVENOUS
  Filled 2018-04-10: qty 10

## 2018-04-10 MED ORDER — INSULIN ASPART 100 UNIT/ML ~~LOC~~ SOLN
0.0000 [IU] | Freq: Every day | SUBCUTANEOUS | Status: DC
  Administered 2018-04-10: 2 [IU] via SUBCUTANEOUS
  Administered 2018-04-11: 5 [IU] via SUBCUTANEOUS
  Filled 2018-04-10 (×2): qty 1

## 2018-04-10 MED ORDER — WARFARIN SODIUM 10 MG PO TABS
10.0000 mg | ORAL_TABLET | Freq: Once | ORAL | Status: AC
  Administered 2018-04-10: 10 mg via ORAL
  Filled 2018-04-10: qty 1

## 2018-04-10 MED ORDER — INSULIN ASPART 100 UNIT/ML ~~LOC~~ SOLN
0.0000 [IU] | Freq: Three times a day (TID) | SUBCUTANEOUS | Status: DC
  Administered 2018-04-10: 2 [IU] via SUBCUTANEOUS
  Administered 2018-04-11: 5 [IU] via SUBCUTANEOUS
  Administered 2018-04-11 (×2): 3 [IU] via SUBCUTANEOUS
  Administered 2018-04-12: 9 [IU] via SUBCUTANEOUS
  Administered 2018-04-12: 7 [IU] via SUBCUTANEOUS
  Filled 2018-04-10 (×6): qty 1

## 2018-04-10 MED ORDER — POTASSIUM CHLORIDE CRYS ER 20 MEQ PO TBCR: 40.0000 meq | EXTENDED_RELEASE_TABLET | Freq: Once | ORAL | Status: DC

## 2018-04-10 MED ORDER — DILTIAZEM HCL ER COATED BEADS 180 MG PO CP24
300.0000 mg | ORAL_CAPSULE | Freq: Every day | ORAL | Status: DC
  Administered 2018-04-10 – 2018-04-12 (×3): 300 mg via ORAL
  Filled 2018-04-10 (×3): qty 1

## 2018-04-10 MED ORDER — FUROSEMIDE 10 MG/ML IJ SOLN: 20.0000 mg | Freq: Once | INTRAMUSCULAR | Status: DC

## 2018-04-10 MED ORDER — ACETAMINOPHEN 325 MG PO TABS: 650.0000 mg | ORAL_TABLET | Freq: Four times a day (QID) | ORAL | Status: DC | PRN

## 2018-04-10 MED ORDER — IPRATROPIUM-ALBUTEROL 0.5-2.5 (3) MG/3ML IN SOLN
3.0000 mL | RESPIRATORY_TRACT | Status: DC | PRN
Start: 2018-04-10 — End: 2018-04-12

## 2018-04-10 NOTE — ED Notes (Signed)
Attempted to call report to floor. Primary RN unavailable and off the floor at this time. Will attempt to call again.

## 2018-04-10 NOTE — Progress Notes (Addendum)
ANTICOAGULATION CONSULT NOTE - Initial Consult  Pharmacy Consult for warfarin Indication: atrial fibrillation  Allergies  Allergen Reactions  . Codeine Nausea And Vomiting  . Lisinopril Other (See Comments)    Hypoglycemia  . Onion Rash    Patient Measurements: Height: 6\' 2"  (188 cm) Weight: 217 lb 4.8 oz (98.6 kg) IBW/kg (Calculated) : 82.2 Heparin Dosing Weight:   Vital Signs: Temp: 97.7 F (36.5 C) (05/18 0814) Temp Source: Oral (05/18 0814) BP: 150/86 (05/18 0814) Pulse Rate: 78 (05/18 0814)  Labs: Recent Labs    04/09/18 2022 04/10/18 0145 04/10/18 0735  HGB 9.7*  --  10.0*  HCT 31.1*  --  30.8*  PLT 202  --  190  CREATININE 3.68*  --  4.01*  TROPONINI <0.03 <0.03 <0.03    Estimated Creatinine Clearance: 24.2 mL/min (A) (by C-G formula based on SCr of 4.01 mg/dL (H)).   Medical History: Past Medical History:  Diagnosis Date  . Atrial fibrillation ([place])   . CAD in native artery   . Diabetes mellitus   . Hyperlipidemia   . Renal disorder    Reports stage 4 kidney disease, pt not on dialysis    Medications:  Infusions:    Assessment: 7 yom with ADHF dyspnea being diuresed. Cardiologist consults pharmacy to start (restart) VKA for AF. Patient had stopped taking it for unknown reason. Warfarin score 8, last albumin Jan 2019 was 2.8. Baseline INR pending.  Goal of Therapy:  INR 2-3 Monitor platelets by anticoagulation protocol: Yes   Plan:  Wait for baseline INR. Start warfarin 10 mg po x 1 and check INR daily until therapeutic x 2.   Laural Benes, Pharm.D., BCPS Clinical Pharmacist 04/10/2018,3:10 PM
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
## 2018-04-10 NOTE — ED Notes (Signed)
No urine output at this time 

## 2018-04-10 NOTE — Progress Notes (Addendum)
[place] at [place] NAME: [name]    MR#:  [id number]  DATE OF BIRTH:  [birth year]  SUBJECTIVE:  Patient seen and evaluated today No new episodes of chest pain Decreased shortness of breath Has a right lower extremity wound Serial troponin negative so far   REVIEW OF SYSTEMS:    ROS  CONSTITUTIONAL: No documented fever. No fatigue, weakness. No weight gain, no weight loss.  EYES: No blurry or double vision.  ENT: No tinnitus. No postnasal drip. No redness of the oropharynx.  RESPIRATORY: No cough, no wheeze, no hemoptysis. No dyspnea.  CARDIOVASCULAR: No chest pain. No orthopnea. No palpitations. No syncope.  GASTROINTESTINAL: No nausea, no vomiting or diarrhea. No abdominal pain. No melena or hematochezia.  GENITOURINARY: No dysuria or hematuria.  ENDOCRINE: No polyuria or nocturia. No heat or cold intolerance.  HEMATOLOGY: No anemia. No bruising. No bleeding.  INTEGUMENTARY: No rashes. No lesions.  MUSCULOSKELETAL: No arthritis. No swelling. No gout.  NEUROLOGIC: No numbness, tingling, or ataxia. No seizure-type activity.  PSYCHIATRIC: No anxiety. No insomnia. No ADD.   DRUG ALLERGIES:   Allergies  Allergen Reactions  . Codeine Nausea And Vomiting  . Lisinopril Other (See Comments)    Hypoglycemia  . Onion Rash    VITALS:  Blood pressure (!) 150/86, pulse 78, temperature 97.7 F (36.5 C), temperature source Oral, resp. rate 18, height 6\' 2"  (1.88 m), weight 98.6 kg (217 lb 4.8 oz), SpO2 96 %.  PHYSICAL EXAMINATION:   Physical Exam  GENERAL:  57 y.o.-year-old patient lying in the bed with no acute distress.  EYES: Pupils equal, round, reactive to light and accommodation. No scleral icterus. Extraocular muscles intact.  HEENT: Head atraumatic, normocephalic. Oropharynx and nasopharynx clear.  NECK:  Supple, no jugular venous distention. No thyroid enlargement, no tenderness.  LUNGS: Normal breath sounds  bilaterally, decreased basal crepitations heard. No use of accessory muscles of respiration.  CARDIOVASCULAR: S1, S2 normal. No murmurs, rubs, or gallops.  ABDOMEN: Soft, nontender, nondistended. Bowel sounds present. No organomegaly or mass.  EXTREMITIES: No cyanosis, clubbing or edema b/l.   Right ankle bandage noted  NEUROLOGIC: Cranial nerves II through XII are intact. No focal Motor or sensory deficits b/l.   PSYCHIATRIC: The patient is alert and oriented x 3.  SKIN: No obvious rash, lesion, or ulcer.   LABORATORY PANEL:   CBC Recent Labs  Lab 04/10/18 0735  WBC 5.3  HGB 10.0*  HCT 30.8*  PLT 190   ------------------------------------------------------------------------------------------------------------------ Chemistries  Recent Labs  Lab 04/10/18 0735  NA 138  K 3.5  CL 106  CO2 23  GLUCOSE 96  BUN 46*  CREATININE 4.01*  CALCIUM 8.8*   ------------------------------------------------------------------------------------------------------------------  Cardiac Enzymes Recent Labs  Lab 04/10/18 0735  TROPONINI <0.03   ------------------------------------------------------------------------------------------------------------------  RADIOLOGY:  Dg Chest 2 View  Result Date: 04/09/2018 CLINICAL DATA:  Chest pain and shortness of breath. EXAM: CHEST - 2 VIEW COMPARISON:  Chest x-ray dated August 28, 2017. FINDINGS: The heart size and mediastinal contours are within normal limits. Prior CABG. Mild pulmonary vascular congestion. No focal consolidation, pleural effusion, or pneumothorax. No acute osseous abnormality. IMPRESSION: Pulmonary vascular congestion without overt edema. Electronically Signed   By: Titus Dubin M.D.   On: 04/09/2018 20:38     ASSESSMENT AND PLAN:   57 year old male patient with history of atrial fibrillation, coronary artery disease, diabetes mellitus type 2, hyperlipidemia, chronic kidney disease not on dialysis currently under  hospitalist  service for chest pain and heart failure  -Acute Diastolic heart failure exacerbation Follow-up echocardiogram report Diuresis with IV Lasix Follow-up cardiology consultation Troponin negative so far  -Right lower extremity wound Wound care consultation Daily wound dressing  -Type 2 diabetes mellitus Diabetic diet Sliding scale coverage with insulin  -COPD stable  -Coronary artery disease Cycle troponin to rule out ischemia  continue home medications  -GERD Continue PPI   All the records are reviewed and case discussed with Care Management/Social Worker. Management plans discussed with the patient, family and they are in agreement.  CODE STATUS: Full code  DVT Prophylaxis: SCDs  TOTAL TIME TAKING CARE OF THIS PATIENT: 35 minutes.   POSSIBLE D/C IN 1 to 2 DAYS, DEPENDING ON CLINICAL CONDITION.  Saundra Shelling M.D on 04/10/2018 at 1:22 PM  Between 7am to 6pm - Pager - [phone]  After 6pm go to [email or web address] - password [email or web address] [facility]  Office  [phone]  CC: Primary care physician; [facility], [place] [facility]  Note: This dictation was prepared with Dragon dictation along with smaller phrase technology. Any transcriptional errors that result from this process are unintentional.

## 2018-04-11 ENCOUNTER — Inpatient Hospital Stay: Payer: Medicare Other

## 2018-04-11 LAB — BASIC METABOLIC PANEL
Anion gap: 10 (ref 5–15)
BUN: 50 mg/dL — ABNORMAL HIGH (ref 6–20)
CALCIUM: 8.7 mg/dL — AB (ref 8.9–10.3)
CHLORIDE: 100 mmol/L — AB (ref 101–111)
CO2: 23 mmol/L (ref 22–32)
CREATININE: 3.96 mg/dL — AB (ref 0.61–1.24)
GFR, EST AFRICAN AMERICAN: 18 mL/min — AB (ref >60)
GFR, EST NON AFRICAN AMERICAN: 16 mL/min — AB (ref >60)
Glucose, Bld: 241 mg/dL — ABNORMAL HIGH (ref 65–99)
Potassium: 4 mmol/L (ref 3.5–5.1)
SODIUM: 133 mmol/L — AB (ref 135–145)

## 2018-04-11 LAB — PROTIME-INR
INR: 1.13
PROTHROMBIN TIME: 14.4 s (ref 11.4–15.2)

## 2018-04-11 LAB — URINALYSIS, ROUTINE W REFLEX MICROSCOPIC
BACTERIA UA: NONE SEEN
BILIRUBIN URINE: NEGATIVE
Glucose, UA: >=500 mg/dL — AB
Ketones, ur: NEGATIVE mg/dL
LEUKOCYTES UA: NEGATIVE
NITRITE: NEGATIVE
Protein, ur: NEGATIVE mg/dL
SPECIFIC GRAVITY, URINE: 1.003 — AB (ref 1.005–1.030)
Squamous Epithelial / LPF: NONE SEEN (ref 0–5)
pH: 6 (ref 5.0–8.0)

## 2018-04-11 LAB — HIV ANTIBODY (ROUTINE TESTING W REFLEX): HIV SCREEN 4TH GENERATION: NONREACTIVE

## 2018-04-11 LAB — PROTEIN / CREATININE RATIO, URINE
Creatinine, Urine: 28 mg/dL
Protein Creatinine Ratio: 0.86 mg/mg{Cre} — ABNORMAL HIGH (ref 0.00–0.15)
TOTAL PROTEIN, URINE: 24 mg/dL

## 2018-04-11 LAB — GLUCOSE, CAPILLARY
GLUCOSE-CAPILLARY: 278 mg/dL — AB (ref 65–99)
Glucose-Capillary: 248 mg/dL — ABNORMAL HIGH (ref 65–99)
Glucose-Capillary: 223 mg/dL — ABNORMAL HIGH (ref 65–99)
Glucose-Capillary: 390 mg/dL — ABNORMAL HIGH (ref 65–99)

## 2018-04-11 MED ORDER — FUROSEMIDE 40 MG PO TABS
40.0000 mg | ORAL_TABLET | Freq: Two times a day (BID) | ORAL | Status: DC
  Administered 2018-04-11 – 2018-04-12 (×2): 40 mg via ORAL
  Filled 2018-04-11 (×2): qty 1

## 2018-04-11 MED ORDER — WARFARIN SODIUM 10 MG PO TABS
10.0000 mg | ORAL_TABLET | Freq: Once | ORAL | Status: AC
  Administered 2018-04-11: 10 mg via ORAL
  Filled 2018-04-11: qty 1

## 2018-04-11 NOTE — Progress Notes (Addendum)
ANTICOAGULATION CONSULT NOTE - Initial Consult  Pharmacy Consult for warfarin Indication: atrial fibrillation  Allergies  Allergen Reactions  . Codeine Nausea And Vomiting  . Lisinopril Other (See Comments)    Hypoglycemia  . Onion Rash    Patient Measurements: Height: 6\' 2"  (188 cm) Weight: 213 lb 1.6 oz (96.7 kg) IBW/kg (Calculated) : 82.2 Heparin Dosing Weight:   Vital Signs: Temp: 98.1 F (36.7 C) (05/19 0751) Temp Source: Oral (05/19 0751) BP: 161/93 (05/19 0751) Pulse Rate: 79 (05/19 0751)  Labs: Recent Labs    04/09/18 2022 04/10/18 0145 04/10/18 0735 04/10/18 1514 04/11/18 0456  HGB 9.7*  --  10.0*  --   --   HCT 31.1*  --  30.8*  --   --   PLT 202  --  190  --   --   LABPROT  --   --   --  14.7 14.4  INR  --   --   --  1.16 1.13  CREATININE 3.68*  --  4.01*  --  3.96*  TROPONINI <0.03 <0.03 <0.03 <0.03  --     Estimated Creatinine Clearance: 24.5 mL/min (A) (by C-G formula based on SCr of 3.96 mg/dL (H)).   Medical History: Past Medical History:  Diagnosis Date  . Atrial fibrillation ([place])   . CAD in native artery   . Diabetes mellitus   . Hyperlipidemia   . Renal disorder    Reports stage 4 kidney disease, pt not on dialysis    Medications:  Infusions:    Assessment: 79 yom with ADHF dyspnea being diuresed. Cardiologist consults pharmacy to start (restart) VKA for AF. Patient had stopped taking it for unknown reason. Warfarin score 8, last albumin Jan 2019 was 2.8. Baseline INR 1.16.  Goal of Therapy:  INR 2-3 Monitor platelets by anticoagulation protocol: Yes   Plan:  INR 1.13. I will give another 10mg  of warfarin tonight per nomogram. Follow up INR in the AM along with a CBC. Pt is on lovenox prophylactic dose, can continue while INR is subtherapeutic. Could consider therapeutic doses as bridge.  Ramond Dial, Pharm.D., BCPS Clinical Pharmacist 04/11/2018,8:01 AM

## 2018-04-11 NOTE — Progress Notes (Signed)
Advanced care plan.  Purpose of the Encounter: CODE STATUS  Parties in Attendance:Patient Patient's Decision Capacity:Good Subjective/Patient's story: Presented for chest pain and difficulty breathing Objective/Medical story Admitted for heart failure Needs diuresis with lasix IV Goals of care determination:  Advance directives discussed with patient and goals of care notified For now patient wants everything done which includes cardiac resuscitation, intubation and ventilator if the need arises. CODE STATUS: Full code Time spent discussing advanced care planning: 16 minutes
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
## 2018-04-11 NOTE — Progress Notes (Signed)
[place] [facility] [place] NAME: [name]    MR#:  [id number]  DATE OF BIRTH:  1961-05-22  SUBJECTIVE:  Patient seen and evaluated today No new episodes of chest pain Decreased shortness of breath Diuresed well after IV Lasix was given Has a right lower extremity wound Serial troponin negative so far   REVIEW OF SYSTEMS:    ROS  CONSTITUTIONAL: No documented fever. No fatigue, weakness. No weight gain, no weight loss.  EYES: No blurry or double vision.  ENT: No tinnitus. No postnasal drip. No redness of the oropharynx.  RESPIRATORY: No cough, no wheeze, no hemoptysis.decreased dyspnea.  CARDIOVASCULAR: No chest pain. No orthopnea. No palpitations. No syncope.  GASTROINTESTINAL: No nausea, no vomiting or diarrhea. No abdominal pain. No melena or hematochezia.  GENITOURINARY: No dysuria or hematuria.  ENDOCRINE: No polyuria or nocturia. No heat or cold intolerance.  HEMATOLOGY: No anemia. No bruising. No bleeding.  INTEGUMENTARY: No rashes. No lesions.  Right foot wound MUSCULOSKELETAL: No arthritis. No swelling. No gout.  NEUROLOGIC: No numbness, tingling, or ataxia. No seizure-type activity.  PSYCHIATRIC: No anxiety. No insomnia. No ADD.   DRUG ALLERGIES:   Allergies  Allergen Reactions  . Codeine Nausea And Vomiting  . Lisinopril Other (See Comments)    Hypoglycemia  . Onion Rash    VITALS:  Blood pressure (!) 161/93, pulse 79, temperature 98.1 F (36.7 C), temperature source Oral, resp. rate 18, height 6\' 2"  (1.88 m), weight 96.7 kg (213 lb 1.6 oz), SpO2 96 %.  PHYSICAL EXAMINATION:   Physical Exam  GENERAL:  57 y.o.-year-old patient lying in the bed with no acute distress.  EYES: Pupils equal, round, reactive to light and accommodation. No scleral icterus. Extraocular muscles intact.  HEENT: Head atraumatic, normocephalic. Oropharynx and nasopharynx clear.  NECK:  Supple, no jugular venous distention. No thyroid  enlargement, no tenderness.  LUNGS: Normal breath sounds bilaterally, decreased basal crepitations heard. No use of accessory muscles of respiration.  CARDIOVASCULAR: S1, S2 normal. No murmurs, rubs, or gallops.  ABDOMEN: Soft, nontender, nondistended. Bowel sounds present. No organomegaly or mass.  EXTREMITIES: No cyanosis, clubbing or edema b/l.   Right ankle bandage noted  NEUROLOGIC: Cranial nerves II through XII are intact. No focal Motor or sensory deficits b/l.   PSYCHIATRIC: The patient is alert and oriented x 3.  SKIN: No obvious rash, lesion, or ulcer.   LABORATORY PANEL:   CBC Recent Labs  Lab 04/10/18 0735  WBC 5.3  HGB 10.0*  HCT 30.8*  PLT 190   ------------------------------------------------------------------------------------------------------------------ Chemistries  Recent Labs  Lab 04/11/18 0456  NA 133*  K 4.0  CL 100*  CO2 23  GLUCOSE 241*  BUN 50*  CREATININE 3.96*  CALCIUM 8.7*   ------------------------------------------------------------------------------------------------------------------  Cardiac Enzymes Recent Labs  Lab 04/10/18 1514  TROPONINI <0.03   ------------------------------------------------------------------------------------------------------------------  RADIOLOGY:  Dg Chest 2 View  Result Date: 04/09/2018 CLINICAL DATA:  Chest pain and shortness of breath. EXAM: CHEST - 2 VIEW COMPARISON:  Chest x-ray dated August 28, 2017. FINDINGS: The heart size and mediastinal contours are within normal limits. Prior CABG. Mild pulmonary vascular congestion. No focal consolidation, pleural effusion, or pneumothorax. No acute osseous abnormality. IMPRESSION: Pulmonary vascular congestion without overt edema. Electronically Signed   By: Titus Dubin M.D.   On: 04/09/2018 20:38     ASSESSMENT AND PLAN:   57 year old male patient with history of atrial fibrillation, coronary artery disease, diabetes mellitus type 2, hyperlipidemia,  chronic kidney disease not on dialysis currently under hospitalist service for chest pain and heart failure  -Acute Diastolic heart failure exacerbation improving Echo EF 55 to 60%, normal RV and dilated IVC DC iv lasix Switched to PO lasix Cardiology f/u appreciated Troponin negative so far  -Right lower extremity wound Wound care consultation Daily wound dressing Follow-up peripheral arterial Dopplers  -Type 2 diabetes mellitus Diabetic diet Sliding scale coverage with insulin  -COPD stable  -Coronary artery disease Cycle troponin to rule out ischemia  continue home medications DC aspirin when coumadin is theraputic  -GERD Continue PPI  -Atrial fibrillation which appears to be chronic Discussed with cardiology Advised anticoagulation with Coumadin No need for bridging with Lovenox patient not on anticoagulation for the last 1 year Patient currently on oral Coumadin 10 mg Discontinue Lovenox subcutaneously Needs to follow-up Coumadin clinic  -CKD stage IV Monitor creatinine   All the records are reviewed and case discussed with Care Management/Social Worker. Management plans discussed with the patient, family and they are in agreement.  CODE STATUS: Full code  DVT Prophylaxis: SCDs  TOTAL TIME TAKING CARE OF THIS PATIENT: 34 minutes.   POSSIBLE D/C IN 1  DAYS, DEPENDING ON CLINICAL CONDITION.  Saundra Shelling M.D on 04/11/2018 at 12:40 PM  Between 7am to 6pm - Pager - [phone]  After 6pm go to [email or web address] - password [email or web address] [facility]  Office  [phone]  CC: Primary care physician; [facility], [facility]  Note: This dictation was prepared with Dragon dictation along with smaller phrase technology. Any transcriptional errors that result from this process are unintentional.

## 2018-04-11 NOTE — Progress Notes (Signed)
Patient ID: [name], male   DOB: 1961-09-17, 57 y.o.   MRN: [id number]     PCP-Cardiologist: No primary care provider on file.   Subjective:    Very good diuresis yesterday.  Creatinine stable.  Breathing improved.  He was started back on warfarin for atrial fibrillation.    Objective:   Weight Range: 213 lb 1.6 oz (96.7 kg) Body mass index is 27.36 kg/m.   Vital Signs:   Temp:  [97.7 F (36.5 C)-98.1 F (36.7 C)] 98.1 F (36.7 C) (05/19 0751) Pulse Rate:  [61-79] 79 (05/19 0751) Resp:  [18] 18 (05/19 0751) BP: (116-161)/(71-93) 161/93 (05/19 0751) SpO2:  [96 %-99 %] 96 % (05/19 0751) Weight:  [213 lb 1.6 oz (96.7 kg)] 213 lb 1.6 oz (96.7 kg) (05/19 0428) Last BM Date: 04/10/18  Weight change: Filed Weights   04/10/18 0048 04/10/18 0607 04/11/18 0428  Weight: 219 lb 6.4 oz (99.5 kg) 217 lb 4.8 oz (98.6 kg) 213 lb 1.6 oz (96.7 kg)    Intake/Output:   Intake/Output Summary (Last 24 hours) at 04/11/2018 1035 Last data filed at 04/11/2018 0955 Gross per 24 hour  Intake 480 ml  Output 6150 ml  Net -5670 ml      Physical Exam    General:  Well appearing. No resp difficulty HEENT: Normal Neck: Supple. JVP 8 cm with HJR. No lymphadenopathy or thyromegaly appreciated. Cor: PMI nondisplaced. Irregular rate & rhythm. No rubs, gallops or murmurs. Lungs: Clear Abdomen: Soft, nontender, nondistended. No hepatosplenomegaly. No bruits or masses. Good bowel sounds. Extremities: No cyanosis, clubbing, rash, edema Neuro: Alert & orientedx3, cranial nerves grossly intact. moves all 4 extremities w/o difficulty. Affect pleasant   Telemetry   Atrial fibrillation in 70s (personally reviewed)  Labs    CBC Recent Labs    04/09/18 2022 04/10/18 0735  WBC 6.4 5.3  HGB 9.7* 10.0*  HCT 31.1* 30.8*  MCV 67.1* 66.2*  PLT 202 488   Basic Metabolic Panel Recent Labs    04/10/18 0735 04/11/18 0456  NA 138 133*  K 3.5 4.0  CL 106 100*  CO2 23 23  GLUCOSE 96 241*   BUN 46* 50*  CREATININE 4.01* 3.96*  CALCIUM 8.8* 8.7*   Liver Function Tests No results for input(s): AST, ALT, ALKPHOS, BILITOT, PROT, ALBUMIN in the last 72 hours. No results for input(s): LIPASE, AMYLASE in the last 72 hours. Cardiac Enzymes Recent Labs    04/10/18 0145 04/10/18 0735 04/10/18 1514  TROPONINI <0.03 <0.03 <0.03    BNP: BNP (last 3 results) Recent Labs    04/09/18 2022  BNP 277.0*    ProBNP (last 3 results) No results for input(s): PROBNP in the last 8760 hours.   D-Dimer No results for input(s): DDIMER in the last 72 hours. Hemoglobin A1C No results for input(s): HGBA1C in the last 72 hours. Fasting Lipid Panel No results for input(s): CHOL, HDL, LDLCALC, TRIG, CHOLHDL, LDLDIRECT in the last 72 hours. Thyroid Function Tests No results for input(s): TSH, T4TOTAL, T3FREE, THYROIDAB in the last 72 hours.  Invalid input(s): FREET3  Other results:   Imaging     No results found.   Medications:     Scheduled Medications: . aspirin EC  81 mg Oral Daily  . atorvastatin  40 mg Oral QPM  . diltiazem  300 mg Oral Daily  . enoxaparin (LOVENOX) injection  30 mg Subcutaneous Q24H  . furosemide  40 mg Oral BID  . insulin aspart  0-5  Units Subcutaneous QHS  . insulin aspart  0-9 Units Subcutaneous TID WC  . metoprolol  200 mg Oral Daily  . mometasone-formoterol  2 puff Inhalation BID  . pantoprazole  40 mg Oral Daily  . tamsulosin  0.4 mg Oral Daily  . warfarin  10 mg Oral ONCE-1800  . Warfarin - Pharmacist Dosing Inpatient   Does not apply q1800     Infusions:   PRN Medications:  acetaminophen **OR** acetaminophen, ipratropium-albuterol, nitroGLYCERIN, ondansetron **OR** ondansetron (ZOFRAN) IV, oxyCODONE   Assessment/Plan   1. Acute on chronic presumed diastolic CHF: Patient was admitted with dyspnea, volume overload on exam.  He certainly is at higher risk for CHF exacerbation with his degree of renal dysfunction.  Echo this  admission showed Ef 55-60%, normal RV, dilated IVC.  He has diuresed well with IV Lasix.  Creatinine stable at 3.96.  At this point, I think he is only mildly volume overloaded.  - One more dose of IV Lasix, then switch to Lasix 40 mg po bid for home (was taking 40 mg daily prior to admission).   2. CAD: H/o CABG x 1 at [facility] in 2010, records not available.  He had chest pain in association with dyspnea yesterday that was persistent for about 2 hours.  He has had no further chest pain.  Troponin negative x 3 despite prolonged chest pain.   - Continue ASA 81 and statin for now, can stop ASA when INR is therapeutic.   - In absence of recurrent chest pain or elevated troponin and with normal EF on echo, would not do further ischemic workup at this time as threshold for cardiac cath is high with CKD stage 4.  3. Atrial fibrillation: This appears chronic with afib on ECG since at least 2017.  Rate is controlled with Toprol XL/diltiazem CD. He used to take warfarin.  He is not sure why he was no longer on it, he does not recall a GI bleed.  CHADSVASC = 4.  I restarted warfarin yesterday.  I do not think he needs to be bridged with heparin/Lovenox.  - He does not have to be therapeutic on warfarin prior to discharge, but he will need coumadin clinic followup.  Can do with his PCP or with [facility] if he desires.  - At this time, rate is controlled with diltiazem CD and Toprol XL.  4. Anemia: Probably anemia of renal disease but will follow.  Mild.  5. CKD: Stage 4.  Follow closely with diuresis.  6. COPD: Prior smoker, no wheezing.  7. RLE wound: Would get peripheral arterial dopplers to assess for PAD as contributor (ordered).   Length of Stay: 2  Loralie Champagne, MD  04/11/2018, 10:35 AM  Advanced Heart Failure Team Pager [phone] (M-F; 7a - 4p)  Please contact [facility] for night-coverage after hours (4p -7a ) and weekends on [email or web address]
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
## 2018-04-12 ENCOUNTER — Telehealth: Payer: Self-pay | Admitting: *Deleted

## 2018-04-12 LAB — RENAL FUNCTION PANEL
ALBUMIN: 3.6 g/dL (ref 3.5–5.0)
ANION GAP: 13 (ref 5–15)
BUN: 67 mg/dL — AB (ref 6–20)
CHLORIDE: 96 mmol/L — AB (ref 101–111)
CO2: 24 mmol/L (ref 22–32)
Calcium: 8.9 mg/dL (ref 8.9–10.3)
Creatinine, Ser: 4.07 mg/dL — ABNORMAL HIGH (ref 0.61–1.24)
GFR calc non Af Amer: 15 mL/min — ABNORMAL LOW (ref >60)
GFR, EST AFRICAN AMERICAN: 18 mL/min — AB (ref >60)
Glucose, Bld: 306 mg/dL — ABNORMAL HIGH (ref 65–99)
PHOSPHORUS: 4.7 mg/dL — AB (ref 2.5–4.6)
POTASSIUM: 3.9 mmol/L (ref 3.5–5.1)
Sodium: 133 mmol/L — ABNORMAL LOW (ref 135–145)

## 2018-04-12 LAB — CBC
HEMATOCRIT: 34 % — AB (ref 40.0–52.0)
HEMOGLOBIN: 10.6 g/dL — AB (ref 13.0–18.0)
MCH: 20.7 pg — ABNORMAL LOW (ref 26.0–34.0)
MCHC: 31.4 g/dL — ABNORMAL LOW (ref 32.0–36.0)
MCV: 65.9 fL — AB (ref 80.0–100.0)
Platelets: 220 10*3/uL (ref 150–440)
RBC: 5.16 MIL/uL (ref 4.40–5.90)
RDW: 19.1 % — AB (ref 11.5–14.5)
WBC: 7.3 10*3/uL (ref 3.8–10.6)

## 2018-04-12 LAB — PROTIME-INR
INR: 1.2
Prothrombin Time: 15.1 seconds (ref 11.4–15.2)

## 2018-04-12 LAB — GLUCOSE, CAPILLARY
GLUCOSE-CAPILLARY: 98 mg/dL (ref 65–99)
GLUCOSE-CAPILLARY: 310 mg/dL — AB (ref 65–99)
GLUCOSE-CAPILLARY: 362 mg/dL — AB (ref 65–99)

## 2018-04-12 MED ORDER — WARFARIN SODIUM 6 MG PO TABS: 6.0000 mg | ORAL_TABLET | Freq: Every day | ORAL | 0 refills | Status: AC

## 2018-04-12 MED ORDER — FUROSEMIDE 40 MG PO TABS: 40.0000 mg | ORAL_TABLET | Freq: Two times a day (BID) | ORAL | 1 refills | Status: AC

## 2018-04-12 NOTE — Telephone Encounter (Signed)
-----   Message from Blain Pais sent at 04/12/2018 11:41 AM EDT ----- Regarding: tcm/ph 6/4 2:00 Murray Hodgkins, NP

## 2018-04-12 NOTE — Care Management (Signed)
Patient does not have financial resources to pay for a set of scales which will be a vital part part of managing his heart failure. Provided with a set of digital scales.

## 2018-04-12 NOTE — Telephone Encounter (Signed)
Attempted to contact pt to schedule INR check.  No answer, vm box is full, unable to leave message.

## 2018-04-12 NOTE — Discharge Instructions (Signed)

## 2018-04-12 NOTE — Progress Notes (Signed)
Progress Note  Patient Name: [name] Date of Encounter: 04/12/2018  Primary Cardiologist: new to [facility] - consult by Aundra Dubin, will follow in [place]   Subjective   No chest pain or dyspnea. SOB "much better." Documented UOP of 11 L for the admission, though intake is not recorded. Weight 213-->192 pounds. Renal function stable.   Inpatient Medications    Scheduled Meds: . aspirin EC  81 mg Oral Daily  . atorvastatin  40 mg Oral QPM  . diltiazem  300 mg Oral Daily  . furosemide  40 mg Oral BID  . insulin aspart  0-5 Units Subcutaneous QHS  . insulin aspart  0-9 Units Subcutaneous TID WC  . metoprolol  200 mg Oral Daily  . mometasone-formoterol  2 puff Inhalation BID  . pantoprazole  40 mg Oral Daily  . tamsulosin  0.4 mg Oral Daily  . Warfarin - Pharmacist Dosing Inpatient   Does not apply q1800   Continuous Infusions:  PRN Meds: acetaminophen **OR** acetaminophen, ipratropium-albuterol, nitroGLYCERIN, ondansetron **OR** ondansetron (ZOFRAN) IV, oxyCODONE   Vital Signs    Vitals:   04/11/18 1629 04/11/18 2003 04/12/18 0448 04/12/18 0739  BP: 127/77 115/68 123/81 121/79  Pulse: 80 (!) 53 68 69  Resp: 18 17 17    Temp: 98 F (36.7 C) 98.5 F (36.9 C) 97.9 F (36.6 C) 98.6 F (37 C)  TempSrc: Oral Oral Oral Oral  SpO2: 98% 100% 100% 100%  Weight:   192 lb (87.1 kg)   Height:        Intake/Output Summary (Last 24 hours) at 04/12/2018 0941 Last data filed at 04/12/2018 0848 Gross per 24 hour  Intake 480 ml  Output 3900 ml  Net -3420 ml   Filed Weights   04/10/18 0607 04/11/18 0428 04/12/18 0448  Weight: 217 lb 4.8 oz (98.6 kg) 213 lb 1.6 oz (96.7 kg) 192 lb (87.1 kg)    Telemetry    Afib, 60-70s bpm - Personally Reviewed  ECG    n/a - Personally Reviewed  Physical Exam   GEN: No acute distress.   Neck: No JVD. Cardiac: Irregularly irregular, no murmurs, rubs, or gallops.  Respiratory: Clear to auscultation bilaterally.  GI: Soft,  nontender, non-distended.   MS: No edema; No deformity. Neuro:  Alert and oriented x 3; Nonfocal.  Psych: Normal affect.  Labs    Chemistry Recent Labs  Lab 04/10/18 0735 04/11/18 0456 04/12/18 0541  NA 138 133* 133*  K 3.5 4.0 3.9  CL 106 100* 96*  CO2 23 23 24   GLUCOSE 96 241* 306*  BUN 46* 50* 67*  CREATININE 4.01* 3.96* 4.07*  CALCIUM 8.8* 8.7* 8.9  ALBUMIN  --   --  3.6  GFRNONAA 15* 16* 15*  GFRAA 18* 18* 18*  ANIONGAP 9 10 13      Hematology Recent Labs  Lab 04/09/18 2022 04/10/18 0735 04/12/18 0541  WBC 6.4 5.3 7.3  RBC 4.63 4.65 5.16  HGB 9.7* 10.0* 10.6*  HCT 31.1* 30.8* 34.0*  MCV 67.1* 66.2* 65.9*  MCH 20.9* 21.4* 20.7*  MCHC 31.1* 32.3 31.4*  RDW 18.8* 18.9* 19.1*  PLT 202 190 220    Cardiac Enzymes Recent Labs  Lab 04/09/18 2022 04/10/18 0145 04/10/18 0735 04/10/18 1514  TROPONINI <0.03 <0.03 <0.03 <0.03   No results for input(s): TROPIPOC in the last 168 hours.   BNP Recent Labs  Lab 04/09/18 2022  BNP 277.0*     DDimer No results for input(s): DDIMER  in the last 168 hours.   Radiology    US Renal  Result Date: 04/11/2018 IMPRESSION: No hydronephrosis. Mild increased renal cortical echogenicity as can be seen with chronic medical renal disease. Electronically Signed   By: Lovey Newcomer M.D.   On: 04/11/2018 15:03    Cardiac Studies   TTE 04/10/2018: Study Conclusions  - Left ventricle: The cavity size was normal. Wall thickness was   normal. Indeterminant diastolic function (atrial fibrillation).   Systolic function was normal. The estimated ejection fraction was   in the range of 55% to 60%. Although no diagnostic regional wall   motion abnormality was identified, this possibility cannot be   completely excluded on the basis of this study. - Aortic valve: There was no stenosis. - Mitral valve: Mildly calcified annulus. Mildly calcified leaflets   . There was trivial regurgitation. - Left atrium: The atrium was mildly  dilated. - Right ventricle: The cavity size was normal. Systolic function   was normal. - Pulmonary arteries: No complete TR doppler jet so unable to   estimate PA systolic pressure. - Systemic veins: IVC measured 2.7 cm with < 50% respirophasic   variation, suggesting RA pressure 15 mmHg.  Impressions:  - The patient was in atrial fibrillation. Normal LV size with EF   55-60%. Normal RV size and systolic function. Dilated IVC   suggestive of elevated RV filling pressure.  Patient Profile     57 y.o. male with history of CAD s/p CABG x 1 in 2010, likely chronic Afib previously on Coumadin, chronic diastolic CHF, CKD stage IV, DM, HTN, and RLE ulceration who was admitted to [facility] with chest pain and dyspnea.   Assessment & Plan    1. Acute on chronic diastolic CHF: -Diuresed well, though intake is not accurate -Weight 213-->192 pounds -Resumed PO Lasix today at 40 mg bid  2. CAD s/p CABG x 1 in 2010 with chest pain: -Ruled out -Echo with normal EF as above -High threshold for LHC given prolonged chest pain with normal troponin, echo with preserved LVSF and CKD stage IV -ASA for now, stop when INR is therapeutic  -Follow up as an outpatient  3. Chronic Afib: -Ventricular rates well controlled -Continue Cardizem CD and Toprol XL -Restarted on Coumadin 6/56 (uncertain why this was held) -He will need follow up in our Coumadin Clinic (I will send a message) -Stop ASA when INR is therapeutic   4. Anemia of likely chronic disease: -Stable  5. CKD stage IV: -Renal function relatively stable -Nephrology on board   6. COPD: -Per IM  7. RLE wound: -Lower extremity arterial ultrasound has been recommended -Appears this will need to be completed as an outpatient   For questions or updates, please contact [place] Please consult [email or web address] for contact info under Cardiology/STEMI.    Signed, Christell Faith, PA-C [place] Pager: [phone] 04/12/2018, 9:41  AM

## 2018-04-12 NOTE — Care Management Important Message (Signed)
Important Message  Patient Details  Name: [name] MRN: [id number] Date of Birth: 1961-07-09   Medicare Important Message Given:  Yes    Juliann Pulse A Andron Marrazzo 04/12/2018, 11:57 AM

## 2018-04-12 NOTE — Telephone Encounter (Signed)
Patient currently admitted
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
## 2018-04-12 NOTE — Discharge Summary (Signed)
[place] [facility] [place] NAME: [name]    MR#:  [id number]  DATE OF BIRTH:  Jun 01, 1961  DATE OF ADMISSION:  04/09/2018 ADMITTING PHYSICIAN: Lance Coon, MD  DATE OF DISCHARGE: 04/12/2018  PRIMARY CARE PHYSICIAN: Medicine, [employer]   ADMISSION DIAGNOSIS:  Dyspnea Chest pain Congestive heart failure exacerbation Chronic atrial fibrillation CKD stage IV Diabetes mellitus type 2 Coronary artery disease Hyperlipidemia  DISCHARGE DIAGNOSIS:  Acute on chronic diastolic heart failure Dyspnea secondary to heart failure exacerbation Chronic atrial fibrillation CKD stage IV Type 2 diabetes mellitus  Coronary artery disease  SECONDARY DIAGNOSIS:   Past Medical History:  Diagnosis Date  . Atrial fibrillation ([place])   . CAD in native artery   . Diabetes mellitus   . Hyperlipidemia   . Renal disorder    Reports stage 4 kidney disease, pt not on dialysis     ADMITTING HISTORY [name]  is a 57 y.o. male who presents with shortness of breath today followed by chest discomfort.  Patient has known coronary disease with a single bypass.  Work-up in the ED tonight is consistent with heart failure, the patient does not have a previous diagnosis of heart failure.  His BNP is somewhat elevated today.   HOSPITAL COURSE:  Patient was admitted to telemetry.  He was diuresed with IV Lasix 60 IV every 12 hourly.  He was seen by [facility] health cardiology.  Patient was worked up with echocardiogram.  Patient diuresed well.  His creatinine was monitored closely which continued to be stable.  Patient has not been on any anticoagulation for the last 1 year.  Patient was started on Coumadin for anticoagulation along with Lovenox subcutaneously.  Cardiology recommended Lovenox to be discontinued and advised patient to continue Coumadin with INR check in the clinic. once INR is therapeutic patient to stop aspirin.  Patient shortness of breath improved  after being diuresed well with Lasix intravenous Lasix was stopped and patient was put on oral Lasix. Echocardiogram : The patient was in atrial fibrillation. Normal LV size with EF   55-60%. Normal RV size and systolic function. Dilated IVC   suggestive of elevated RV filling pressure. Wound care consult patient was also done for the right foot wound.  Patient continued medical management for his diabetes mellitus.  Patient hemodynamically stable will be discharged home with follow-up appointments in the clinic.  Patient will [facility] with [facility] nephrology as outpatient.  CONSULTS OBTAINED:  Treatment Team:  Saundra Shelling, MD Larey Dresser, MD Lavonia Dana, MD  DRUG ALLERGIES:   Allergies  Allergen Reactions  . Codeine Nausea And Vomiting  . Lisinopril Other (See Comments)    Hypoglycemia  . Onion Rash    DISCHARGE MEDICATIONS:   Allergies as of 04/12/2018      Reactions   Codeine Nausea And Vomiting   Lisinopril Other (See Comments)   Hypoglycemia   Onion Rash      Medication List    TAKE these medications   ADVAIR DISKUS 250-50 MCG/DOSE Aepb Generic drug:  Fluticasone-Salmeterol Inhale 1 puff into the lungs 2 (two) times daily.   aspirin EC 81 MG tablet Take 81 mg by mouth daily.   atorvastatin 40 MG tablet Commonly known as:  LIPITOR Take 40 mg by mouth every evening.   BASAGLAR KWIKPEN 100 UNIT/ML Sopn Inject 65 Units into the skin every morning.   diltiazem 300 MG 24 hr capsule Commonly known as:  CARDIZEM CD Take  300 mg by mouth daily.   furosemide 40 MG tablet Commonly known as:  LASIX Take 1 tablet (40 mg total) by mouth 2 (two) times daily. What changed:  when to take this   metoprolol 200 MG 24 hr tablet Commonly known as:  TOPROL-XL Take 200 mg by mouth daily.   NOVOLIN R 100 units/mL injection Generic drug:  insulin regular Inject 10 Units into the skin 3 (three) times daily before meals.   ondansetron 4 MG disintegrating  tablet Commonly known as:  ZOFRAN ODT Take 1 tablet (4 mg total) by mouth every 8 (eight) hours as needed for nausea or vomiting.   oxyCODONE-acetaminophen 5-325 MG tablet Commonly known as:  ROXICET Take 1 tablet by mouth every 6 (six) hours as needed.   pantoprazole 40 MG tablet Commonly known as:  PROTONIX Take 40 mg by mouth daily.   tamsulosin 0.4 MG Caps capsule Commonly known as:  FLOMAX Take 0.4 mg by mouth daily.   warfarin 6 MG tablet Commonly known as:  COUMADIN Take 1 tablet (6 mg total) by mouth daily.       Today  Patient seen and evaluated on the day of discharge No chest pain No shortness of breath No palpitations  VITAL SIGNS:  Blood pressure 121/79, pulse 69, temperature 98.6 F (37 C), temperature source Oral, resp. rate 17, height 6\' 2"  (1.88 m), weight 87.1 kg (192 lb), SpO2 100 %.  I/O:    Intake/Output Summary (Last 24 hours) at 04/12/2018 1344 Last data filed at 04/12/2018 1313 Gross per 24 hour  Intake 480 ml  Output 2925 ml  Net -2445 ml    PHYSICAL EXAMINATION:  Physical Exam  GENERAL:  57 y.o.-year-old patient lying in the bed with no acute distress.  LUNGS: Normal breath sounds bilaterally, no wheezing, rales,rhonchi or crepitation. No use of accessory muscles of respiration.  CARDIOVASCULAR: S1, S2 normal. No murmurs, rubs, or gallops.  ABDOMEN: Soft, non-tender, non-distended. Bowel sounds present. No organomegaly or mass.  NEUROLOGIC: Moves all 4 extremities. PSYCHIATRIC: The patient is alert and oriented x 3.  SKIN: right lower extremity wound  DATA REVIEW:   CBC Recent Labs  Lab 04/12/18 0541  WBC 7.3  HGB 10.6*  HCT 34.0*  PLT 220    Chemistries  Recent Labs  Lab 04/12/18 0541  NA 133*  K 3.9  CL 96*  CO2 24  GLUCOSE 306*  BUN 67*  CREATININE 4.07*  CALCIUM 8.9    Cardiac Enzymes Recent Labs  Lab 04/10/18 1514  Bruce <0.03    Microbiology Results  No results found for this or any previous  visit.  RADIOLOGY:  US Renal  Result Date: 04/11/2018 CLINICAL DATA:  History of chronic renal disease. EXAM: RENAL / URINARY TRACT ULTRASOUND COMPLETE COMPARISON:  CT abdomen pelvis 08/28/2017 FINDINGS: Right Kidney: Length: 11.2 cm. Normal renal cortical thickness. Mild increased echogenicity. No hydronephrosis. Left Kidney: Length: 12.1 cm. Normal renal cortical thickness. Mild increased echogenicity. No hydronephrosis. Bladder: Appears normal for degree of bladder distention. IMPRESSION: No hydronephrosis. Mild increased renal cortical echogenicity as can be seen with chronic medical renal disease. Electronically Signed   By: Lovey Newcomer M.D.   On: 04/11/2018 15:03    Follow up with PCP in 1 week.  Management plans discussed with the patient, family and they are in agreement.  CODE STATUS: Full code    Code Status Orders  (From admission, onward)        Start  Ordered   04/10/18 0044  Full code  Continuous     04/10/18 0043    Code Status History    This patient has a current code status but no historical code status.      TOTAL TIME TAKING CARE OF THIS PATIENT ON DAY OF DISCHARGE: more than 35 minutes.   Saundra Shelling M.D on 04/12/2018 at 1:44 PM  Between 7am to 6pm - Pager - [phone]  After 6pm go to [email or web address] - password [email or web address] [facility]  Office  [phone]  CC: Primary care physician; [facility], [employer] [facility]  Note: This dictation was prepared with Dragon dictation along with smaller phrase technology. Any transcriptional errors that result from this process are unintentional.

## 2018-04-13 NOTE — Telephone Encounter (Signed)
No answer. Voicemail full, unable to leave a message.

## 2018-04-14 NOTE — Telephone Encounter (Signed)
No answer. Voicemail full, unable to leave a message.

## 2018-04-15 NOTE — Telephone Encounter (Signed)
Patient contacted regarding discharge from [facility] on Monday 04/12/18.  Patient understands to follow up with provider Ignacia Bayley, NP on 04/27/18 at 2:00 pm at the [facility] office. Patient understands discharge instructions? yes Patient understands medications and regiment? yes Patient understands to bring all medications to this visit? yes  The patient states he is currently holding his warfarin in preparation for a colonoscopy- date undetermined as of today. He is waiting on a call back from GI.

## 2018-04-26 ENCOUNTER — Ambulatory Visit: Payer: Medicare Other | Admitting: Family

## 2018-04-27 ENCOUNTER — Ambulatory Visit: Payer: Medicare Other | Admitting: Nurse Practitioner

## 2018-04-29 NOTE — Progress Notes (Deleted)
   Patient ID: [name], male    DOB: [birth year], 57 y.o.   MRN: [id number]  HPI  [name] is a 57 y/o male with a history of   Echo report from 04/10/18 reviewed and showed an EF of 55-60% along with trivial Maurice and dilated IVC suggestive of elevated RV filling pressure.   Admitted 04/09/18 due to HF exacerbation. Initially needed IV lasix and then transitioned to oral diuretics. Nephrology and cardiology consults obtained. Medications adjusted and he was discharged after 3 days.   He presents for his initial visit with a chief complaint of   Review of Systems    Physical Exam    Assessment & Plan:  1: Chronic heart failure with preserved ejection fraction- - NYHA class  2: HTN-  3: Atrial fibrillation-

## 2018-04-30 ENCOUNTER — Ambulatory Visit: Payer: Medicare Other | Admitting: Family

## 2018-05-28 ENCOUNTER — Telehealth: Payer: Self-pay | Admitting: Family

## 2018-05-28 ENCOUNTER — Ambulatory Visit: Payer: Medicare Other | Admitting: Family

## 2018-05-28 NOTE — Telephone Encounter (Signed)
Patient missed his initial appointment at the [facility] 05/28/18. Will attempt to reschedule

## 2018-06-11 ENCOUNTER — Telehealth: Payer: Self-pay | Admitting: Nurse Practitioner

## 2018-06-11 NOTE — Telephone Encounter (Signed)
L MOM for pt to call and scheduled previous cancelled appt and also coumadin appointment. Letter also sent

## 2018-06-11 NOTE — Telephone Encounter (Signed)
L MOM for pt to call to schedule appt with Murray Hodgkins, NP, and also needs restart coumadin appt. Letter also sent .

## 2018-06-11 NOTE — Telephone Encounter (Signed)
-----   Message from Stana Bunting, RN sent at 06/07/2018  4:26 PM EDT ----- Can you help with this?  I need your expertise!  ----- Message ----- From: Sindy Messing Sent: 06/07/2018   3:54 PM To: Stana Bunting, RN  Sounds good. Can we send him a certified, unable to reach letter?  Rhy[place] ----- Message ----- From: Stana Bunting, RN Sent: 06/07/2018   2:35 PM To: Rise Mu, PA-C  Ryan, I just wanted to let you know that I have been unable to reach this gentleman and he has not shown for any of his appts since his discharge.  His phone does not have working vm and he has not answered anytime that I have called. He had an appt @ the HF clinic and I put a note in for them to have him call me, but he did not show for that appt either. Apparently GI is holding is coumadin 2/2 bleed, but he does not have a DOS for colonoscopy yet.   I'll keep him in my basket so I don't forget about him and try to reach out periodically to see if I can catch him.  -Mandi  ----- Message ----- From: Sindy Messing Sent: 04/12/2018  10:15 AM To: Stana Bunting, RN  Restart. Was previously on Coumadin, held for unclear reasons, and restarted on 04/11/18.  ----- Message ----- From: Stana Bunting, RN Sent: 04/12/2018   9:58 AM To: Rise Mu, PA-C  New coumadin?  ----- Message ----- From: Sindy Messing Sent: 04/12/2018   9:49 AM To: Stana Bunting, RN  Patient will need Coumadin Clinic follow up this week.

## 2018-08-04 ENCOUNTER — Encounter: Payer: Self-pay | Admitting: Intensive Care

## 2018-08-04 ENCOUNTER — Other Ambulatory Visit: Payer: Self-pay

## 2018-08-04 ENCOUNTER — Emergency Department: Payer: Medicare Other

## 2018-08-04 ENCOUNTER — Emergency Department
Admission: EM | Admit: 2018-08-04 | Discharge: 2018-08-04 | Disposition: A | Discharge status: Home / Self Care | Payer: Medicare Other | Attending: Emergency Medicine | Admitting: Emergency Medicine

## 2018-08-04 DIAGNOSIS — I5032 Chronic diastolic (congestive) heart failure: Secondary | ICD-10-CM | POA: Diagnosis not present

## 2018-08-04 DIAGNOSIS — N184 Chronic kidney disease, stage 4 (severe): Secondary | ICD-10-CM | POA: Insufficient documentation

## 2018-08-04 DIAGNOSIS — E1122 Type 2 diabetes mellitus with diabetic chronic kidney disease: Secondary | ICD-10-CM | POA: Diagnosis not present

## 2018-08-04 DIAGNOSIS — I25119 Atherosclerotic heart disease of native coronary artery with unspecified angina pectoris: Secondary | ICD-10-CM | POA: Diagnosis not present

## 2018-08-04 DIAGNOSIS — E785 Hyperlipidemia, unspecified: Secondary | ICD-10-CM | POA: Insufficient documentation

## 2018-08-04 DIAGNOSIS — Z7982 Long term (current) use of aspirin: Secondary | ICD-10-CM | POA: Insufficient documentation

## 2018-08-04 DIAGNOSIS — I482 Chronic atrial fibrillation: Secondary | ICD-10-CM | POA: Diagnosis not present

## 2018-08-04 DIAGNOSIS — R42 Dizziness and giddiness: Secondary | ICD-10-CM | POA: Diagnosis present

## 2018-08-04 DIAGNOSIS — Z87891 Personal history of nicotine dependence: Secondary | ICD-10-CM | POA: Diagnosis not present

## 2018-08-04 DIAGNOSIS — Z79899 Other long term (current) drug therapy: Secondary | ICD-10-CM | POA: Insufficient documentation

## 2018-08-04 DIAGNOSIS — Z794 Long term (current) use of insulin: Secondary | ICD-10-CM | POA: Insufficient documentation

## 2018-08-04 DIAGNOSIS — R55 Syncope and collapse: Secondary | ICD-10-CM | POA: Insufficient documentation

## 2018-08-04 LAB — BRAIN NATRIURETIC PEPTIDE: B NATRIURETIC PEPTIDE 5: 140 pg/mL — AB (ref 0.0–100.0)

## 2018-08-04 LAB — BASIC METABOLIC PANEL
ANION GAP: 14 (ref 5–15)
BUN: 40 mg/dL — ABNORMAL HIGH (ref 6–20)
CHLORIDE: 99 mmol/L (ref 98–111)
CO2: 20 mmol/L — ABNORMAL LOW (ref 22–32)
CREATININE: 3.46 mg/dL — AB (ref 0.61–1.24)
Calcium: 9 mg/dL (ref 8.9–10.3)
GFR calc non Af Amer: 18 mL/min — ABNORMAL LOW (ref >60)
GFR, EST AFRICAN AMERICAN: 21 mL/min — AB (ref >60)
Glucose, Bld: 291 mg/dL — ABNORMAL HIGH (ref 70–99)
POTASSIUM: 4.8 mmol/L (ref 3.5–5.1)
SODIUM: 133 mmol/L — AB (ref 135–145)

## 2018-08-04 LAB — CBC
HEMATOCRIT: 47.4 % (ref 40.0–52.0)
HEMOGLOBIN: 15.7 g/dL (ref 13.0–18.0)
MCH: 27.7 pg (ref 26.0–34.0)
MCHC: 33.1 g/dL (ref 32.0–36.0)
MCV: 83.6 fL (ref 80.0–100.0)
Platelets: 209 10*3/uL (ref 150–440)
RBC: 5.68 MIL/uL (ref 4.40–5.90)
RDW: 22 % — ABNORMAL HIGH (ref 11.5–14.5)
WBC: 11.7 10*3/uL — ABNORMAL HIGH (ref 3.8–10.6)

## 2018-08-04 LAB — TROPONIN I: Troponin I: <0.03 ng/mL (ref <0.03)

## 2018-08-04 NOTE — Discharge Instructions (Addendum)
Think you got sick feeling like this because of you are not eating enough.  Please do your best to eat regularly.  3 small meals a day and a snack before bed would be best with a diabetes.  Please go down to your doctor at [facility] and see if they feel it would be a good idea to evaluate you for diabetic gastroparesis.  Since you are not hungry for days on end it might be that your stomach is not emptying properly.  This is a condition that can be treated and it might help you do better in the long run.

## 2018-08-04 NOTE — ED Triage Notes (Signed)
PAtient arrived by EMS with c/o lightheadedness, dizziness, and blurry vision while he was driving. Patient went to [facility] to get some food when he started feeling this way because he thought it was due to low blood sugar. Patient reports "I didn't eat at all yesterday and hadnt eaten today before [employer]" Patient is independent and ambulates with cane. A&O x4 upon arrival to ER
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
## 2018-12-08 ENCOUNTER — Encounter: Payer: Self-pay | Admitting: Emergency Medicine

## 2018-12-08 ENCOUNTER — Emergency Department: Payer: Medicare Other

## 2018-12-08 ENCOUNTER — Emergency Department
Admission: EM | Admit: 2018-12-08 | Discharge: 2018-12-08 | Disposition: A | Discharge status: Home / Self Care | Payer: Medicare Other | Attending: Emergency Medicine | Admitting: Emergency Medicine

## 2018-12-08 DIAGNOSIS — R1084 Generalized abdominal pain: Secondary | ICD-10-CM | POA: Insufficient documentation

## 2018-12-08 DIAGNOSIS — I5033 Acute on chronic diastolic (congestive) heart failure: Secondary | ICD-10-CM | POA: Diagnosis not present

## 2018-12-08 DIAGNOSIS — I2510 Atherosclerotic heart disease of native coronary artery without angina pectoris: Secondary | ICD-10-CM | POA: Insufficient documentation

## 2018-12-08 DIAGNOSIS — R112 Nausea with vomiting, unspecified: Secondary | ICD-10-CM | POA: Diagnosis not present

## 2018-12-08 DIAGNOSIS — Z87891 Personal history of nicotine dependence: Secondary | ICD-10-CM | POA: Diagnosis not present

## 2018-12-08 DIAGNOSIS — E1122 Type 2 diabetes mellitus with diabetic chronic kidney disease: Secondary | ICD-10-CM | POA: Insufficient documentation

## 2018-12-08 DIAGNOSIS — N184 Chronic kidney disease, stage 4 (severe): Secondary | ICD-10-CM | POA: Insufficient documentation

## 2018-12-08 DIAGNOSIS — I129 Hypertensive chronic kidney disease with stage 1 through stage 4 chronic kidney disease, or unspecified chronic kidney disease: Secondary | ICD-10-CM | POA: Insufficient documentation

## 2018-12-08 DIAGNOSIS — Z951 Presence of aortocoronary bypass graft: Secondary | ICD-10-CM | POA: Diagnosis not present

## 2018-12-08 LAB — CBC
HEMATOCRIT: 42.9 % (ref 39.0–52.0)
HEMOGLOBIN: 14.6 g/dL (ref 13.0–17.0)
MCH: 29.4 pg (ref 26.0–34.0)
MCHC: 34 g/dL (ref 30.0–36.0)
MCV: 86.5 fL (ref 80.0–100.0)
PLATELETS: 178 10*3/uL (ref 150–400)
RBC: 4.96 MIL/uL (ref 4.22–5.81)
RDW: 14.7 % (ref 11.5–15.5)
WBC: 14.9 10*3/uL — AB (ref 4.0–10.5)
nRBC: 0 % (ref 0.0–0.2)

## 2018-12-08 LAB — COMPREHENSIVE METABOLIC PANEL
ALBUMIN: 3.5 g/dL (ref 3.5–5.0)
ALK PHOS: 146 U/L — AB (ref 38–126)
ALT: 25 U/L (ref 0–44)
ANION GAP: 10 (ref 5–15)
AST: 21 U/L (ref 15–41)
BILIRUBIN TOTAL: 2.1 mg/dL — AB (ref 0.3–1.2)
BUN: 38 mg/dL — ABNORMAL HIGH (ref 6–20)
CALCIUM: 8.8 mg/dL — AB (ref 8.9–10.3)
CO2: 19 mmol/L — AB (ref 22–32)
CREATININE: 2.82 mg/dL — AB (ref 0.61–1.24)
Chloride: 107 mmol/L (ref 98–111)
GFR calc Af Amer: 28 mL/min — ABNORMAL LOW (ref >60)
GFR calc non Af Amer: 24 mL/min — ABNORMAL LOW (ref >60)
GLUCOSE: 241 mg/dL — AB (ref 70–99)
Potassium: 3.6 mmol/L (ref 3.5–5.1)
Sodium: 136 mmol/L (ref 135–145)
Total Protein: 7 g/dL (ref 6.5–8.1)

## 2018-12-08 LAB — LIPASE, BLOOD: Lipase: 38 U/L (ref 11–51)

## 2018-12-08 LAB — INFLUENZA PANEL BY PCR (TYPE A & B)
Influenza A By PCR: NEGATIVE
Influenza B By PCR: NEGATIVE

## 2018-12-08 LAB — TROPONIN I: Troponin I: <0.03 ng/mL (ref <0.03)

## 2018-12-08 MED ORDER — DILTIAZEM HCL 25 MG/5ML IV SOLN
10.0000 mg | Freq: Once | INTRAVENOUS | Status: AC
  Administered 2018-12-08: 10 mg via INTRAVENOUS
  Filled 2018-12-08: qty 5

## 2018-12-08 MED ORDER — ONDANSETRON HCL 4 MG/2ML IJ SOLN
4.0000 mg | Freq: Once | INTRAMUSCULAR | Status: AC
  Administered 2018-12-08: 4 mg via INTRAVENOUS
  Filled 2018-12-08: qty 2

## 2018-12-08 MED ORDER — ONDANSETRON 4 MG PO TBDP
4.0000 mg | ORAL_TABLET | Freq: Once | ORAL | Status: AC | PRN
  Administered 2018-12-08: 4 mg via ORAL
  Filled 2018-12-08: qty 1

## 2018-12-08 MED ORDER — PROMETHAZINE HCL 12.5 MG PO TABS: 12.5000 mg | ORAL_TABLET | Freq: Four times a day (QID) | ORAL | 0 refills | Status: DC | PRN

## 2018-12-08 MED ORDER — SODIUM CHLORIDE 0.9 % IV SOLN
Freq: Once | INTRAVENOUS | Status: AC
  Administered 2018-12-08: 15:00:00 via INTRAVENOUS

## 2018-12-08 MED ORDER — MORPHINE SULFATE (PF) 4 MG/ML IV SOLN
4.0000 mg | Freq: Once | INTRAVENOUS | Status: AC
  Administered 2018-12-08: 4 mg via INTRAVENOUS
  Filled 2018-12-08: qty 1
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
## 2018-12-08 NOTE — ED Notes (Signed)
PT calling ride at this time.

## 2018-12-08 NOTE — ED Provider Notes (Signed)
Patient received in sign-out from Dr. Jimmye Norman.  Workup and evaluation pending flu and reassessment.  Patient feels much improved.  Tolerating oral hydration.  Currently rate controlled.  Flu is negative.  Otherwise feeling asymptomatic at this time.  Likely viral process.  At this point I do believe he is stable and appropriate for outpatient follow-up.Merlyn Lot, MD 12/08/18 1659

## 2018-12-08 NOTE — ED Triage Notes (Signed)
Patient presents to the ED with nausea and vomiting since last night, cold chills, abdominal pain and chest pain.  Patient reports occasional cough.

## 2019-04-22 ENCOUNTER — Emergency Department
Admission: EM | Admit: 2019-04-22 | Discharge: 2019-04-22 | Disposition: A | Discharge status: Home / Self Care | Payer: Medicare Other | Attending: Emergency Medicine | Admitting: Emergency Medicine

## 2019-04-22 ENCOUNTER — Other Ambulatory Visit: Payer: Self-pay

## 2019-04-22 ENCOUNTER — Encounter: Payer: Self-pay | Admitting: Emergency Medicine

## 2019-04-22 DIAGNOSIS — N184 Chronic kidney disease, stage 4 (severe): Secondary | ICD-10-CM | POA: Insufficient documentation

## 2019-04-22 DIAGNOSIS — E162 Hypoglycemia, unspecified: Secondary | ICD-10-CM | POA: Diagnosis present

## 2019-04-22 DIAGNOSIS — I130 Hypertensive heart and chronic kidney disease with heart failure and stage 1 through stage 4 chronic kidney disease, or unspecified chronic kidney disease: Secondary | ICD-10-CM | POA: Diagnosis not present

## 2019-04-22 DIAGNOSIS — Z951 Presence of aortocoronary bypass graft: Secondary | ICD-10-CM | POA: Insufficient documentation

## 2019-04-22 DIAGNOSIS — Z794 Long term (current) use of insulin: Secondary | ICD-10-CM | POA: Diagnosis not present

## 2019-04-22 DIAGNOSIS — Z7982 Long term (current) use of aspirin: Secondary | ICD-10-CM | POA: Diagnosis not present

## 2019-04-22 DIAGNOSIS — I5022 Chronic systolic (congestive) heart failure: Secondary | ICD-10-CM | POA: Insufficient documentation

## 2019-04-22 DIAGNOSIS — I5032 Chronic diastolic (congestive) heart failure: Secondary | ICD-10-CM | POA: Diagnosis not present

## 2019-04-22 DIAGNOSIS — E11649 Type 2 diabetes mellitus with hypoglycemia without coma: Secondary | ICD-10-CM | POA: Diagnosis not present

## 2019-04-22 DIAGNOSIS — Z87891 Personal history of nicotine dependence: Secondary | ICD-10-CM | POA: Insufficient documentation

## 2019-04-22 DIAGNOSIS — J449 Chronic obstructive pulmonary disease, unspecified: Secondary | ICD-10-CM | POA: Diagnosis not present

## 2019-04-22 DIAGNOSIS — F1210 Cannabis abuse, uncomplicated: Secondary | ICD-10-CM | POA: Insufficient documentation

## 2019-04-22 LAB — CBC
HCT: 45.6 % (ref 39.0–52.0)
Hemoglobin: 14.9 g/dL (ref 13.0–17.0)
MCH: 28.7 pg (ref 26.0–34.0)
MCHC: 32.7 g/dL (ref 30.0–36.0)
MCV: 87.7 fL (ref 80.0–100.0)
Platelets: 217 10*3/uL (ref 150–400)
RBC: 5.2 MIL/uL (ref 4.22–5.81)
RDW: 15.2 % (ref 11.5–15.5)
WBC: 8 10*3/uL (ref 4.0–10.5)
nRBC: 0 % (ref 0.0–0.2)

## 2019-04-22 LAB — GLUCOSE, CAPILLARY
Glucose-Capillary: 86 mg/dL (ref 70–99)
Glucose-Capillary: 105 mg/dL — ABNORMAL HIGH (ref 70–99)
Glucose-Capillary: 143 mg/dL — ABNORMAL HIGH (ref 70–99)

## 2019-04-22 LAB — COMPREHENSIVE METABOLIC PANEL
ALT: 34 U/L (ref 0–44)
AST: 33 U/L (ref 15–41)
Albumin: 3.4 g/dL — ABNORMAL LOW (ref 3.5–5.0)
Alkaline Phosphatase: 156 U/L — ABNORMAL HIGH (ref 38–126)
Anion gap: 10 (ref 5–15)
BUN: 51 mg/dL — ABNORMAL HIGH (ref 6–20)
CO2: 20 mmol/L — ABNORMAL LOW (ref 22–32)
Calcium: 8.8 mg/dL — ABNORMAL LOW (ref 8.9–10.3)
Chloride: 109 mmol/L (ref 98–111)
Creatinine, Ser: 3.56 mg/dL — ABNORMAL HIGH (ref 0.61–1.24)
GFR calc Af Amer: 21 mL/min — ABNORMAL LOW (ref >60)
GFR calc non Af Amer: 18 mL/min — ABNORMAL LOW (ref >60)
Glucose, Bld: 90 mg/dL (ref 70–99)
Potassium: 4.1 mmol/L (ref 3.5–5.1)
Sodium: 139 mmol/L (ref 135–145)
Total Bilirubin: 1 mg/dL (ref 0.3–1.2)
Total Protein: 7.4 g/dL (ref 6.5–8.1)

## 2019-04-22 NOTE — ED Notes (Signed)
Patient given milk, peanut butter and graham crackers to eat.

## 2019-04-22 NOTE — ED Triage Notes (Addendum)
Arrives [facility] for c/o hypoglycemia.  Seen through ED yesterday for an arm comoplaint.  Pt from home.  PMH:  DM -- Takes 60 units Basilar QAM and NPH up to 12 units QPM.  States took 12 units last night.   this am sugar was 71, ate something but took insulin.  CBG initially 48.   EMS infused 300 ml D10.  last CBG:  98.  18g LAC started by EMS. VS wnl.
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
## 2021-04-06 ENCOUNTER — Other Ambulatory Visit: Payer: Self-pay

## 2021-04-06 ENCOUNTER — Emergency Department: Payer: Medicare HMO

## 2021-04-06 ENCOUNTER — Emergency Department
Admission: EM | Admit: 2021-04-06 | Discharge: 2021-04-07 | Disposition: A | Discharge status: Home / Self Care | Payer: Medicare HMO | Attending: Emergency Medicine | Admitting: Emergency Medicine

## 2021-04-06 DIAGNOSIS — R4182 Altered mental status, unspecified: Secondary | ICD-10-CM | POA: Insufficient documentation

## 2021-04-06 DIAGNOSIS — S90416A Abrasion, unspecified lesser toe(s), initial encounter: Secondary | ICD-10-CM

## 2021-04-06 DIAGNOSIS — E162 Hypoglycemia, unspecified: Secondary | ICD-10-CM

## 2021-04-06 LAB — CBC WITH DIFFERENTIAL/PLATELET
Abs Immature Granulocytes: 0.04 10*3/uL (ref 0.00–0.07)
Basophils Absolute: 0.1 10*3/uL (ref 0.0–0.1)
Basophils Relative: 1 %
Eosinophils Absolute: 0 10*3/uL (ref 0.0–0.5)
Eosinophils Relative: 0 %
HCT: 50.3 % (ref 39.0–52.0)
Hemoglobin: 16.2 g/dL (ref 13.0–17.0)
Immature Granulocytes: 0 %
Lymphocytes Relative: 18 %
Lymphs Abs: 1.8 10*3/uL (ref 0.7–4.0)
MCH: 27.3 pg (ref 26.0–34.0)
MCHC: 32.2 g/dL (ref 30.0–36.0)
MCV: 84.8 fL (ref 80.0–100.0)
Monocytes Absolute: 0.8 10*3/uL (ref 0.1–1.0)
Monocytes Relative: 8 %
Neutro Abs: 7.6 10*3/uL (ref 1.7–7.7)
Neutrophils Relative %: 73 %
Platelets: 208 10*3/uL (ref 150–400)
RBC: 5.93 MIL/uL — ABNORMAL HIGH (ref 4.22–5.81)
RDW: 18.4 % — ABNORMAL HIGH (ref 11.5–15.5)
WBC: 10.3 10*3/uL (ref 4.0–10.5)
nRBC: 0 % (ref 0.0–0.2)

## 2021-04-06 LAB — BASIC METABOLIC PANEL
Anion gap: 14 (ref 5–15)
BUN: 82 mg/dL — ABNORMAL HIGH (ref 6–20)
CO2: 20 mmol/L — ABNORMAL LOW (ref 22–32)
Calcium: 9.1 mg/dL (ref 8.9–10.3)
Chloride: 102 mmol/L (ref 98–111)
Creatinine, Ser: 4.66 mg/dL — ABNORMAL HIGH (ref 0.61–1.24)
GFR, Estimated: 14 mL/min — ABNORMAL LOW (ref >60)
Glucose, Bld: 149 mg/dL — ABNORMAL HIGH (ref 70–99)
Potassium: 4.7 mmol/L (ref 3.5–5.1)
Sodium: 136 mmol/L (ref 135–145)

## 2021-04-06 LAB — CBG MONITORING, ED: Glucose-Capillary: 146 mg/dL — ABNORMAL HIGH (ref 70–99)

## 2021-04-06 NOTE — ED Provider Notes (Signed)
  Physical Exam  BP 112/67   Pulse 93   Temp 97.9 F (36.6 C) (Oral)   Resp 19   Ht '6\' 2"'$  (1.88 m)   Wt 99.8 kg   SpO2 100%   BMI 28.25 kg/m   Physical Exam  ED Course/Procedures   Clinical Course as of 04/06/21 2349  Sat Apr 06, 2021  2311 Reassessed.  Patient reports he feels well.  Friend at the bedside provide some additional history and indicates that patient seems fine.  They report that his typical kidney number is less than 10 and that he is being worked up for a possible transplant.  We discussed plan of care, including benign work-up so far and possible outpatient management after brief observation time and recheck of his glucose levels.  He has already eaten a [place] sandwich, drink some juice and says he feels okay. [DS]    Clinical Course User Index [DS] Vladimir Crofts, MD    Procedures  MDM  11:30 PM  Assumed care.  Patient here with hypoglycemia.  Improving.  Tolerating po here.  Plan to recheck CBG but anticipate dc home.  12:52 AM Pt reports he is feeling much better.  Blood sugar went from the 140s up to the 180s and then again at 262.  Tolerating p.o. here.  Labs show chronic kidney disease which she reports is chronic.  Urine shows no sign of infection.  Chest x-ray was concerning for possible mid left lung infiltrate but he denies any cough, fever, chest pain, shortness of breath.  He has no fever here no leukocytosis.  Low suspicion for pneumonia clinically.  We did discuss this finding and have recommended close follow-up with his primary care doctor if he were to develop symptoms as he may need to be started on antibiotics and/or tested for COVID and influenza.  Patient is comfortable with this plan.  Will discharge home.   At this time, I do not feel there is any life-threatening condition present. I have reviewed, interpreted and discussed all results (EKG, imaging, lab, urine as appropriate) and exam findings with patient/family. I have reviewed nursing  notes and appropriate previous records.  I feel the patient is safe to be discharged home without further emergent workup and can continue workup as an outpatient as needed. Discussed usual and customary return precautions. Patient/family verbalize understanding and are comfortable with this plan.  Outpatient follow-up has been provided as needed. All questions have been answered.     George Haggart, Delice Bison, DO 04/07/21 [phone]

## 2021-04-06 NOTE — ED Triage Notes (Signed)
Pt arrives from home via [facility] after friend found pt in his home altered -- pt back to baseline upon arrival to ED.  First responders report checked pt blood glucose and BGL mid 60s initially -- 4 glucose tablets subsequently administered with improvement to 87 however when checked a third time BGL was trending downward to 70s.  EMS subsequently placed 20G SL to lower R FA and administered 262m D10.  Pt reportedly had taken all his pre-meal insulin today but only ate a cheeseburger.  R heel diabetic ulcer wrapped prior to arrival and pt bleeding to R great toe happened prior to arrival when pt reportedly was on his way to shower and felt himself falling --pt did fall back onto bed- denies LOC - states head only hit mattress but pt did hit R great toe falling backward.

## 2021-04-06 NOTE — ED Provider Notes (Signed)
[facility] Emergency Department Provider Note ____________________________________________   Event Date/Time   First MD Initiated Contact with Patient 04/06/21 2124     (approximate)  I have reviewed the triage vital signs and the nursing notes.  HISTORY  Chief Complaint Hypoglycemia and Altered Mental Status (Pt arrives from home via [facility] after friend found pt in his home altered -- pt back to baseline at this time.  First responders checked blood glucose and BGL mid 60s-- 4 glucose tablets given with improvement to 87 however BGL began to drop again to the 70s.  EMS subsequently arrived and placed SL - able to administer D10 enroute with improvement to 140;  BGL upon ED arrival 146.   EMS reports pt had taken all his scheduled pre-meal Insulins today but only ate a cheeseburger.  )   HPI [name] is a 60 y.o. malewho presents to the ED for evaluation of hypoglycemia.   Chart review indicates no hx in our system.  Patient self-reports a history of diabetes for which he takes 50 units of long-acting insulin in the morning, mealtime insulin, and another 50 units at night.  ASA 81 without proper anticoagulation.  HTN and HLD.  Patient reports taking his morning insulin and feeling normal throughout the day today, but only having 1 meal -a cheeseburger-which is unusual for him to only have 1 meal.  He reports this evening as he was getting ready to make himself dinner, he was voiding and felt lightheaded dizziness and a sensation of hypoglycemia that he is accustomed to.  He reports try to make it to the kitchen, but striking his right foot on nearby furniture, causing him to stumble and he landed on his bed just outside the bathroom.  Denies additional trauma beyond stubbing his toe, denies syncope, head trauma.   EMS reports finding him with glucose in the 60s improved with oral glucose and D10.   Patient reports a diabetic wound to his right heel  that has been present for "a couple weeks" without purulence.  Denies fevers.    No past medical history on file.  There are no problems to display for this patient.     Prior to Admission medications   Not on File    Allergies Codeine, Lisinopril, and Onion  No family history on file.  Social History    Review of Systems  Constitutional: No fever/chills.  Positive generalized weakness and presyncope Eyes: No visual changes. ENT: No sore throat. Cardiovascular: Denies chest pain. Respiratory: Denies shortness of breath. Gastrointestinal: No abdominal pain.  No nausea, no vomiting.  No diarrhea.  No constipation. Genitourinary: Negative for dysuria. Musculoskeletal: Negative for back pain. Skin: Negative for rash. Neurological: Negative for headaches, focal weakness or numbness.  ____________________________________________   PHYSICAL EXAM:  VITAL SIGNS: Vitals:   04/06/21 2200 04/06/21 2300  BP: 112/79 112/67  Pulse: 83 93  Resp: 18 19  Temp:    SpO2: 100% 100%    Constitutional: Alert and oriented. Well appearing and in no acute distress.  Sitting up in bed, pleasant and conversational in full sentences. Eyes: Conjunctivae are normal. PERRL. EOMI. Head: Atraumatic. Nose: No congestion/rhinnorhea. Mouth/Throat: Mucous membranes are moist.  Oropharynx non-erythematous. Neck: No stridor. No cervical spine tenderness to palpation. Cardiovascular: Normal rate, regular rhythm. Grossly normal heart sounds.  Good peripheral circulation. Respiratory: Normal respiratory effort.  No retractions. Lungs CTAB. Gastrointestinal: Soft , nondistended, nontender to palpation. No CVA tenderness. Musculoskeletal:  No joint  effusions.  Right great toe has small notes of blood at the tip without active bleeding or discrete laceration.  Seem to have come from the nailbed.  No subungual hematoma.  Wound to the heel on the right foot is dressed in clean gauze.  No surrounding  erythema, induration and no purulence.  Nontender. Neurologic:  Normal speech and language. No gross focal neurologic deficits are appreciated.  Cranial nerves II through XII intact 5/5 strength and sensation in all 4 extremities Skin:  Skin is warm, dry and intact. No rash noted. Psychiatric: Mood and affect are normal. Speech and behavior are normal. ____________________________________________   LABS (all labs ordered are listed, but only abnormal results are displayed)  Labs Reviewed  CBC WITH DIFFERENTIAL/PLATELET - Abnormal; Notable for the following components:      Result Value   RBC 5.93 (*)    RDW 18.4 (*)    All other components within normal limits  BASIC METABOLIC PANEL - Abnormal; Notable for the following components:   CO2 20 (*)    Glucose, Bld 149 (*)    BUN 82 (*)    Creatinine, Ser 4.66 (*)    GFR, Estimated 14 (*)    All other components within normal limits  CBG MONITORING, ED - Abnormal; Notable for the following components:   Glucose-Capillary 146 (*)    All other components within normal limits  URINALYSIS, COMPLETE (UACMP) WITH MICROSCOPIC   ____________________________________________  12 Lead EKG  Sinus rhythm, rate of 89 bpm.  Normal axis and normal intervals.  Inferior and lateral T wave inversions without STEMI criteria.  No comparison. ____________________________________________  RADIOLOGY  ED MD interpretation: 2 view CXR reviewed by me without evidence of acute cardiopulmonary pathology.  S/p sternotomy  Official radiology report(s): No results found.  ____________________________________________   PROCEDURES and INTERVENTIONS  Procedure(s) performed (including Critical Care):  .1-3 Lead EKG Interpretation Performed by: Vladimir Crofts, MD Authorized by: Vladimir Crofts, MD     Interpretation: normal     ECG rate:  86   ECG rate assessment: normal     Rhythm: sinus rhythm     Ectopy: none     Conduction: normal       Medications - No data to display  ____________________________________________   MDM / ED COURSE   Pleasant 60 year old male on long-acting insulin presents to the ED after episode of hypoglycemia and a fall associated with this.  Normal vital signs.  Exam is generally reassuring without evidence of neurologic or vascular deficits, or signs of significant trauma.  He does seem to have stubbed his toe to his right great toe with small amount of blood without evidence of laceration or subungual hematoma that would require trephination.  Has a diabetic wound to his right heel that does not appear infected.  Will provide local wound care to these areas.  Blood work shows CKD, apparently little better than his baseline where he indicates his GFR was recently less than 10.  He tolerates p.o. intake and looks clinically well.  We will recheck his glucose after p.o. challenge and anticipate outpatient management thereafter as long as he does not have recurrence of his hypoglycemia.  Patient signed out to oncoming provider to follow-up on this recheck  Clinical Course as of 04/06/21 2313  Sat Apr 06, 2021  2311 Reassessed.  Patient reports he feels well.  Friend at the bedside provide some additional history and indicates that patient seems fine.  They report that his typical kidney  number is less than 10 and that he is being worked up for a possible transplant.  We discussed plan of care, including benign work-up so far and possible outpatient management after brief observation time and recheck of his glucose levels.  He has already eaten a [place] sandwich, drink some juice and says he feels okay. [DS]    Clinical Course User Index [DS] Vladimir Crofts, MD    ____________________________________________   FINAL CLINICAL IMPRESSION(S) / ED DIAGNOSES  Final diagnoses:  Hypoglycemia     ED Discharge Orders    None       Cayleigh Paull Tamala Julian   Note:  This document was prepared using Dragon voice  recognition software and may include unintentional dictation errors.   Vladimir Crofts, MD 04/06/21 [phone]

## 2021-04-06 NOTE — Discharge Instructions (Addendum)
As we discussed, I suspect that you just got behind on your meals in the setting of your long-acting insulin, causing low sugars and your symptoms.  Return to the ED with any further worsening symptoms, episodes of passing out or fevers.  Your chest x-ray question of developing left sided pneumonia.  Given you are not having any symptoms including fevers, cough, chest pain or shortness of breath, we do not feel this needs to be treated at this time as clinically it does not appear that you truly have pneumonia.  If you develop any of the symptoms, I recommend that you follow-up with your primary care doctor to discuss the possibility of being started on antibiotics.

## 2021-04-06 NOTE — ED Notes (Signed)
Patient transported to X-ray 

## 2021-04-07 LAB — URINALYSIS, COMPLETE (UACMP) WITH MICROSCOPIC
Bacteria, UA: NONE SEEN
Bilirubin Urine: NEGATIVE
Glucose, UA: 50 mg/dL — AB
Hgb urine dipstick: NEGATIVE
Ketones, ur: NEGATIVE mg/dL
Leukocytes,Ua: NEGATIVE
Nitrite: NEGATIVE
Protein, ur: >=300 mg/dL — AB
Specific Gravity, Urine: 1.012 (ref 1.005–1.030)
pH: 5 (ref 5.0–8.0)

## 2021-04-07 LAB — CBG MONITORING, ED
Glucose-Capillary: 180 mg/dL — ABNORMAL HIGH (ref 70–99)
Glucose-Capillary: 262 mg/dL — ABNORMAL HIGH (ref 70–99)

## 2021-04-07 MED ORDER — SILVER SULFADIAZINE 1 % EX CREA
TOPICAL_CREAM | Freq: Once | CUTANEOUS | Status: AC
  Filled 2021-04-07: qty 85

## 2021-04-07 NOTE — ED Notes (Signed)
Pt agreeable with plan for d/c home as discussed by provider Dr Jerilynn Mages Ward-- Stanton Kidney, RN (staff nurse) has verbally reinforced d/c instructions and provided pt with written copy - pt acknowledges verbal understanding and denies any additional questions concerns needs- escorted via w/c to exit where friend who is pt's ride is waiting to drive him home

## 2021-04-08 ENCOUNTER — Encounter: Payer: Self-pay | Admitting: Emergency Medicine

## 2022-09-03 ENCOUNTER — Other Ambulatory Visit (INDEPENDENT_AMBULATORY_CARE_PROVIDER_SITE_OTHER): Payer: Self-pay | Admitting: Nurse Practitioner

## 2022-09-03 DIAGNOSIS — N185 Chronic kidney disease, stage 5: Secondary | ICD-10-CM

## 2022-09-04 ENCOUNTER — Ambulatory Visit (INDEPENDENT_AMBULATORY_CARE_PROVIDER_SITE_OTHER): Payer: Self-pay

## 2022-09-04 ENCOUNTER — Encounter (INDEPENDENT_AMBULATORY_CARE_PROVIDER_SITE_OTHER): Payer: Medicare HMO | Admitting: Vascular Surgery

## 2022-09-04 DIAGNOSIS — N185 Chronic kidney disease, stage 5: Secondary | ICD-10-CM

## 2022-09-22 ENCOUNTER — Encounter (INDEPENDENT_AMBULATORY_CARE_PROVIDER_SITE_OTHER): Payer: Medicare HMO | Admitting: Vascular Surgery

## 2022-09-22 ENCOUNTER — Encounter (INDEPENDENT_AMBULATORY_CARE_PROVIDER_SITE_OTHER): Payer: Self-pay

## 2022-09-25 ENCOUNTER — Encounter (INDEPENDENT_AMBULATORY_CARE_PROVIDER_SITE_OTHER): Payer: Self-pay | Admitting: Vascular Surgery

## 2022-09-25 ENCOUNTER — Ambulatory Visit (INDEPENDENT_AMBULATORY_CARE_PROVIDER_SITE_OTHER): Payer: Medicare HMO | Admitting: Vascular Surgery

## 2022-09-25 VITALS — BP 136/77 | HR 83 | Resp 17 | Ht 74.0 in | Wt 205.0 lb

## 2022-09-25 DIAGNOSIS — I482 Chronic atrial fibrillation, unspecified: Secondary | ICD-10-CM

## 2022-09-25 DIAGNOSIS — N185 Chronic kidney disease, stage 5: Secondary | ICD-10-CM | POA: Diagnosis not present

## 2022-09-25 DIAGNOSIS — J449 Chronic obstructive pulmonary disease, unspecified: Secondary | ICD-10-CM

## 2022-09-25 DIAGNOSIS — E1369 Other specified diabetes mellitus with other specified complication: Secondary | ICD-10-CM

## 2022-09-25 DIAGNOSIS — I739 Peripheral vascular disease, unspecified: Secondary | ICD-10-CM

## 2022-09-25 DIAGNOSIS — I25119 Atherosclerotic heart disease of native coronary artery with unspecified angina pectoris: Secondary | ICD-10-CM | POA: Diagnosis not present
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
## 2022-09-29 ENCOUNTER — Encounter (INDEPENDENT_AMBULATORY_CARE_PROVIDER_SITE_OTHER): Payer: Self-pay | Admitting: Vascular Surgery

## 2022-09-29 DIAGNOSIS — N185 Chronic kidney disease, stage 5: Secondary | ICD-10-CM | POA: Insufficient documentation

## 2022-10-31 ENCOUNTER — Telehealth (INDEPENDENT_AMBULATORY_CARE_PROVIDER_SITE_OTHER): Payer: Self-pay

## 2022-10-31 NOTE — Telephone Encounter (Signed)
I attempted to contact the patient to schedule him for a right brachial cephalic fistula with Dr. Delana Meyer. A message was left for a return call.

## 2022-11-06 NOTE — Telephone Encounter (Signed)
Spoke with the patient on 11/05/22 and he has a chest infection and is on antibiotics so he will call and get his surgery scheduled after he has been cleared by his PCP.

## 2022-11-17 ENCOUNTER — Other Ambulatory Visit: Payer: Self-pay

## 2022-11-17 ENCOUNTER — Emergency Department: Payer: Medicare HMO

## 2022-11-17 ENCOUNTER — Inpatient Hospital Stay
Admission: EM | Admit: 2022-11-17 | Discharge: 2022-11-24 | DRG: 871 | Disposition: E | Payer: Medicare HMO | Attending: Internal Medicine | Admitting: Internal Medicine

## 2022-11-17 DIAGNOSIS — J440 Chronic obstructive pulmonary disease with acute lower respiratory infection: Secondary | ICD-10-CM | POA: Diagnosis present

## 2022-11-17 DIAGNOSIS — E162 Hypoglycemia, unspecified: Secondary | ICD-10-CM | POA: Diagnosis present

## 2022-11-17 DIAGNOSIS — J449 Chronic obstructive pulmonary disease, unspecified: Secondary | ICD-10-CM | POA: Diagnosis present

## 2022-11-17 DIAGNOSIS — E785 Hyperlipidemia, unspecified: Secondary | ICD-10-CM | POA: Diagnosis present

## 2022-11-17 DIAGNOSIS — Z888 Allergy status to other drugs, medicaments and biological substances status: Secondary | ICD-10-CM | POA: Diagnosis not present

## 2022-11-17 DIAGNOSIS — Z7901 Long term (current) use of anticoagulants: Secondary | ICD-10-CM

## 2022-11-17 DIAGNOSIS — Z885 Allergy status to narcotic agent status: Secondary | ICD-10-CM

## 2022-11-17 DIAGNOSIS — K3184 Gastroparesis: Secondary | ICD-10-CM | POA: Diagnosis present

## 2022-11-17 DIAGNOSIS — Z7985 Long-term (current) use of injectable non-insulin antidiabetic drugs: Secondary | ICD-10-CM

## 2022-11-17 DIAGNOSIS — I482 Chronic atrial fibrillation, unspecified: Secondary | ICD-10-CM | POA: Diagnosis present

## 2022-11-17 DIAGNOSIS — Z951 Presence of aortocoronary bypass graft: Secondary | ICD-10-CM

## 2022-11-17 DIAGNOSIS — K219 Gastro-esophageal reflux disease without esophagitis: Secondary | ICD-10-CM | POA: Diagnosis present

## 2022-11-17 DIAGNOSIS — E1129 Type 2 diabetes mellitus with other diabetic kidney complication: Secondary | ICD-10-CM | POA: Diagnosis present

## 2022-11-17 DIAGNOSIS — J189 Pneumonia, unspecified organism: Secondary | ICD-10-CM | POA: Diagnosis present

## 2022-11-17 DIAGNOSIS — I4901 Ventricular fibrillation: Secondary | ICD-10-CM | POA: Diagnosis present

## 2022-11-17 DIAGNOSIS — R1084 Generalized abdominal pain: Secondary | ICD-10-CM | POA: Diagnosis not present

## 2022-11-17 DIAGNOSIS — E11649 Type 2 diabetes mellitus with hypoglycemia without coma: Secondary | ICD-10-CM | POA: Diagnosis present

## 2022-11-17 DIAGNOSIS — I469 Cardiac arrest, cause unspecified: Secondary | ICD-10-CM | POA: Diagnosis present

## 2022-11-17 DIAGNOSIS — N185 Chronic kidney disease, stage 5: Secondary | ICD-10-CM | POA: Diagnosis present

## 2022-11-17 DIAGNOSIS — Z794 Long term (current) use of insulin: Secondary | ICD-10-CM

## 2022-11-17 DIAGNOSIS — I5032 Chronic diastolic (congestive) heart failure: Secondary | ICD-10-CM | POA: Diagnosis present

## 2022-11-17 DIAGNOSIS — A419 Sepsis, unspecified organism: Principal | ICD-10-CM | POA: Diagnosis present

## 2022-11-17 DIAGNOSIS — E1143 Type 2 diabetes mellitus with diabetic autonomic (poly)neuropathy: Secondary | ICD-10-CM | POA: Diagnosis present

## 2022-11-17 DIAGNOSIS — Z87891 Personal history of nicotine dependence: Secondary | ICD-10-CM

## 2022-11-17 DIAGNOSIS — I2510 Atherosclerotic heart disease of native coronary artery without angina pectoris: Secondary | ICD-10-CM | POA: Diagnosis present

## 2022-11-17 DIAGNOSIS — N400 Enlarged prostate without lower urinary tract symptoms: Secondary | ICD-10-CM | POA: Diagnosis present

## 2022-11-17 DIAGNOSIS — R112 Nausea with vomiting, unspecified: Secondary | ICD-10-CM

## 2022-11-17 DIAGNOSIS — R0600 Dyspnea, unspecified: Secondary | ICD-10-CM | POA: Diagnosis present

## 2022-11-17 DIAGNOSIS — I214 Non-ST elevation (NSTEMI) myocardial infarction: Secondary | ICD-10-CM | POA: Diagnosis present

## 2022-11-17 DIAGNOSIS — Z91018 Allergy to other foods: Secondary | ICD-10-CM | POA: Diagnosis not present

## 2022-11-17 DIAGNOSIS — E1122 Type 2 diabetes mellitus with diabetic chronic kidney disease: Secondary | ICD-10-CM | POA: Diagnosis present

## 2022-11-17 DIAGNOSIS — R109 Unspecified abdominal pain: Secondary | ICD-10-CM | POA: Diagnosis present

## 2022-11-17 DIAGNOSIS — Z7982 Long term (current) use of aspirin: Secondary | ICD-10-CM

## 2022-11-17 DIAGNOSIS — Z79899 Other long term (current) drug therapy: Secondary | ICD-10-CM

## 2022-11-17 DIAGNOSIS — I462 Cardiac arrest due to underlying cardiac condition: Secondary | ICD-10-CM | POA: Diagnosis present

## 2022-11-17 DIAGNOSIS — Z833 Family history of diabetes mellitus: Secondary | ICD-10-CM

## 2022-11-17 DIAGNOSIS — Z89021 Acquired absence of right finger(s): Secondary | ICD-10-CM

## 2022-11-17 LAB — URINALYSIS, COMPLETE (UACMP) WITH MICROSCOPIC
Bilirubin Urine: NEGATIVE
Glucose, UA: 50 mg/dL — AB
Ketones, ur: NEGATIVE mg/dL
Leukocytes,Ua: NEGATIVE
Nitrite: POSITIVE — AB
Protein, ur: >=300 mg/dL — AB
Specific Gravity, Urine: 1.013 (ref 1.005–1.030)
pH: 5 (ref 5.0–8.0)

## 2022-11-17 LAB — URINE DRUG SCREEN, QUALITATIVE (ARMC ONLY)
Amphetamines, Ur Screen: NOT DETECTED
Barbiturates, Ur Screen: NOT DETECTED
Benzodiazepine, Ur Scrn: NOT DETECTED
Cannabinoid 50 Ng, Ur ~~LOC~~: POSITIVE — AB
Cocaine Metabolite,Ur ~~LOC~~: NOT DETECTED
MDMA (Ecstasy)Ur Screen: NOT DETECTED
Methadone Scn, Ur: NOT DETECTED
Opiate, Ur Screen: NOT DETECTED
Phencyclidine (PCP) Ur S: NOT DETECTED
Tricyclic, Ur Screen: NOT DETECTED

## 2022-11-17 LAB — TROPONIN I (HIGH SENSITIVITY)
Troponin I (High Sensitivity): 69 ng/L — ABNORMAL HIGH (ref <18)
Troponin I (High Sensitivity): 204 ng/L (ref <18)

## 2022-11-17 LAB — LIPASE, BLOOD: Lipase: 33 U/L (ref 11–51)

## 2022-11-17 LAB — CBC
HCT: 39.1 % (ref 39.0–52.0)
Hemoglobin: 12 g/dL — ABNORMAL LOW (ref 13.0–17.0)
MCH: 24.4 pg — ABNORMAL LOW (ref 26.0–34.0)
MCHC: 30.7 g/dL (ref 30.0–36.0)
MCV: 79.5 fL — ABNORMAL LOW (ref 80.0–100.0)
Platelets: 171 10*3/uL (ref 150–400)
RBC: 4.92 MIL/uL (ref 4.22–5.81)
RDW: 18.2 % — ABNORMAL HIGH (ref 11.5–15.5)
WBC: 8.3 10*3/uL (ref 4.0–10.5)
nRBC: 0 % (ref 0.0–0.2)

## 2022-11-17 LAB — BASIC METABOLIC PANEL
Anion gap: 10 (ref 5–15)
BUN: 62 mg/dL — ABNORMAL HIGH (ref 6–20)
CO2: 15 mmol/L — ABNORMAL LOW (ref 22–32)
Calcium: 9 mg/dL (ref 8.9–10.3)
Chloride: 118 mmol/L — ABNORMAL HIGH (ref 98–111)
Creatinine, Ser: 4.34 mg/dL — ABNORMAL HIGH (ref 0.61–1.24)
GFR, Estimated: 15 mL/min — ABNORMAL LOW (ref >60)
Glucose, Bld: 64 mg/dL — ABNORMAL LOW (ref 70–99)
Potassium: 4 mmol/L (ref 3.5–5.1)
Sodium: 143 mmol/L (ref 135–145)

## 2022-11-17 LAB — HEPATIC FUNCTION PANEL
ALT: 16 U/L (ref 0–44)
AST: 26 U/L (ref 15–41)
Albumin: 3.1 g/dL — ABNORMAL LOW (ref 3.5–5.0)
Alkaline Phosphatase: 48 U/L (ref 38–126)
Bilirubin, Direct: 0.3 mg/dL — ABNORMAL HIGH (ref 0.0–0.2)
Indirect Bilirubin: 0.9 mg/dL (ref 0.3–0.9)
Total Bilirubin: 1.2 mg/dL (ref 0.3–1.2)
Total Protein: 6.1 g/dL — ABNORMAL LOW (ref 6.5–8.1)

## 2022-11-17 LAB — CBG MONITORING, ED
Glucose-Capillary: 58 mg/dL — ABNORMAL LOW (ref 70–99)
Glucose-Capillary: 98 mg/dL (ref 70–99)
Glucose-Capillary: 184 mg/dL — ABNORMAL HIGH (ref 70–99)
Glucose-Capillary: 116 mg/dL — ABNORMAL HIGH (ref 70–99)

## 2022-11-17 MED ORDER — EPINEPHRINE 1 MG/10ML IJ SOSY
PREFILLED_SYRINGE | INTRAMUSCULAR | Status: AC | PRN
  Administered 2022-11-17 (×2): .1 mg via INTRAVENOUS

## 2022-11-17 MED ORDER — CALCIUM CHLORIDE 10 % IV SOLN
INTRAVENOUS | Status: AC | PRN
  Administered 2022-11-17: 1 g via INTRAVENOUS

## 2022-11-17 MED ORDER — INSULIN ASPART 100 UNIT/ML IJ SOLN: 0.0000 [IU] | Freq: Every day | INTRAMUSCULAR | Status: DC

## 2022-11-17 MED ORDER — HEPARIN BOLUS VIA INFUSION
4000.0000 [IU] | Freq: Once | INTRAVENOUS | Status: AC
  Administered 2022-11-17: 4000 [IU] via INTRAVENOUS
  Filled 2022-11-17: qty 4000

## 2022-11-17 MED ORDER — FENTANYL CITRATE PF 50 MCG/ML IJ SOSY: 50.0000 ug | PREFILLED_SYRINGE | INTRAMUSCULAR | Status: DC | PRN

## 2022-11-17 MED ORDER — DEXTROSE IN LACTATED RINGERS 5 % IV SOLN
1000.0000 mL | Freq: Once | INTRAVENOUS | Status: AC
  Administered 2022-11-17: 1000 mL via INTRAVENOUS

## 2022-11-17 MED ORDER — NOREPINEPHRINE 4 MG/250ML-% IV SOLN
INTRAVENOUS | Status: AC
  Filled 2022-11-17: qty 250

## 2022-11-17 MED ORDER — METOPROLOL SUCCINATE ER 50 MG PO TB24
200.0000 mg | ORAL_TABLET | Freq: Every day | ORAL | Status: DC
  Administered 2022-11-17: 200 mg via ORAL
  Filled 2022-11-17: qty 4

## 2022-11-17 MED ORDER — ONDANSETRON HCL 4 MG/2ML IJ SOLN
4.0000 mg | Freq: Three times a day (TID) | INTRAMUSCULAR | Status: DC | PRN
  Administered 2022-11-17: 4 mg via INTRAVENOUS
  Filled 2022-11-17: qty 2

## 2022-11-17 MED ORDER — ATORVASTATIN CALCIUM 20 MG PO TABS
40.0000 mg | ORAL_TABLET | Freq: Every evening | ORAL | Status: DC
  Administered 2022-11-17: 40 mg via ORAL
  Filled 2022-11-17: qty 2

## 2022-11-17 MED ORDER — SODIUM BICARBONATE 8.4 % IV SOLN
INTRAVENOUS | Status: AC | PRN
  Administered 2022-11-17: 50 meq via INTRAVENOUS

## 2022-11-17 MED ORDER — FAMOTIDINE 20 MG PO TABS: 10.0000 mg | ORAL_TABLET | Freq: Two times a day (BID) | ORAL | Status: DC

## 2022-11-17 MED ORDER — SODIUM CHLORIDE 0.9 % IV SOLN
1.0000 g | Freq: Once | INTRAVENOUS | Status: AC
  Administered 2022-11-17: 1 g via INTRAVENOUS
  Filled 2022-11-17: qty 10

## 2022-11-17 MED ORDER — METOPROLOL TARTRATE 5 MG/5ML IV SOLN: 5.0000 mg | INTRAVENOUS | Status: DC | PRN

## 2022-11-17 MED ORDER — EPINEPHRINE HCL 5 MG/250ML IV SOLN IN NS
INTRAVENOUS | Status: AC
  Administered 2022-11-17: 10 ug/min via INTRAVENOUS
  Filled 2022-11-17: qty 250

## 2022-11-17 MED ORDER — FUROSEMIDE 40 MG PO TABS: 40.0000 mg | ORAL_TABLET | Freq: Two times a day (BID) | ORAL | Status: DC

## 2022-11-17 MED ORDER — ATORVASTATIN CALCIUM 20 MG PO TABS: 40.0000 mg | ORAL_TABLET | Freq: Every evening | ORAL | Status: DC

## 2022-11-17 MED ORDER — NITROGLYCERIN 0.4 MG SL SUBL: 0.4000 mg | SUBLINGUAL_TABLET | SUBLINGUAL | Status: DC | PRN

## 2022-11-17 MED ORDER — METOCLOPRAMIDE HCL 5 MG/ML IJ SOLN: 5.0000 mg | Freq: Three times a day (TID) | INTRAMUSCULAR | Status: DC

## 2022-11-17 MED ORDER — ACETAMINOPHEN 325 MG PO TABS: 650.0000 mg | ORAL_TABLET | Freq: Four times a day (QID) | ORAL | Status: DC | PRN

## 2022-11-17 MED ORDER — DOCUSATE SODIUM 100 MG PO CAPS: 100.0000 mg | ORAL_CAPSULE | Freq: Two times a day (BID) | ORAL | Status: DC | PRN

## 2022-11-17 MED ORDER — NOREPINEPHRINE 4 MG/250ML-% IV SOLN
INTRAVENOUS | Status: AC | PRN
  Administered 2022-11-17: .2 ug/kg/min via INTRAVENOUS

## 2022-11-17 MED ORDER — ASPIRIN 81 MG PO TBEC: 81.0000 mg | DELAYED_RELEASE_TABLET | Freq: Every day | ORAL | Status: DC

## 2022-11-17 MED ORDER — EPINEPHRINE 1 MG/10ML IJ SOSY
PREFILLED_SYRINGE | INTRAMUSCULAR | Status: AC | PRN
  Administered 2022-11-17 (×3): 1 mg via INTRAVENOUS

## 2022-11-17 MED ORDER — HEPARIN (PORCINE) 25000 UT/250ML-% IV SOLN
1250.0000 [IU]/h | INTRAVENOUS | Status: DC
  Administered 2022-11-17: 1250 [IU]/h via INTRAVENOUS
  Filled 2022-11-17: qty 250

## 2022-11-17 MED ORDER — NOREPINEPHRINE 4 MG/250ML-% IV SOLN: 2.0000 ug/min | INTRAVENOUS | Status: DC

## 2022-11-17 MED ORDER — CALCITRIOL 0.25 MCG PO CAPS: 0.2500 ug | ORAL_CAPSULE | ORAL | Status: DC

## 2022-11-17 MED ORDER — MIDAZOLAM HCL 2 MG/2ML IJ SOLN: 1.0000 mg | INTRAMUSCULAR | Status: DC | PRN

## 2022-11-17 MED ORDER — ONDANSETRON HCL 4 MG/2ML IJ SOLN
4.0000 mg | Freq: Once | INTRAMUSCULAR | Status: AC
  Administered 2022-11-17: 4 mg via INTRAVENOUS

## 2022-11-17 MED ORDER — FAMOTIDINE IN NACL 20-0.9 MG/50ML-% IV SOLN: 20.0000 mg | Freq: Every day | INTRAVENOUS | Status: DC

## 2022-11-17 MED ORDER — EPINEPHRINE HCL 5 MG/250ML IV SOLN IN NS: 0.5000 ug/min | INTRAVENOUS | Status: DC

## 2022-11-17 MED ORDER — SODIUM CHLORIDE 0.9 % IV SOLN
500.0000 mg | Freq: Once | INTRAVENOUS | Status: AC
  Administered 2022-11-17: 500 mg via INTRAVENOUS
  Filled 2022-11-17: qty 5

## 2022-11-17 MED ORDER — ALBUTEROL SULFATE (2.5 MG/3ML) 0.083% IN NEBU: 2.5000 mg | INHALATION_SOLUTION | RESPIRATORY_TRACT | Status: DC | PRN

## 2022-11-17 MED ORDER — DM-GUAIFENESIN ER 30-600 MG PO TB12: 1.0000 | ORAL_TABLET | Freq: Two times a day (BID) | ORAL | Status: DC | PRN

## 2022-11-17 MED ORDER — HYDRALAZINE HCL 20 MG/ML IJ SOLN: 5.0000 mg | INTRAMUSCULAR | Status: DC | PRN

## 2022-11-17 MED ORDER — MAGNESIUM SULFATE 50 % IJ SOLN
INTRAMUSCULAR | Status: AC | PRN
  Administered 2022-11-17: 4 g via INTRAVENOUS

## 2022-11-17 MED ORDER — INSULIN ASPART 100 UNIT/ML IJ SOLN: 0.0000 [IU] | INTRAMUSCULAR | Status: DC

## 2022-11-17 MED ORDER — FENTANYL CITRATE PF 50 MCG/ML IJ SOSY: 25.0000 ug | PREFILLED_SYRINGE | INTRAMUSCULAR | Status: DC | PRN

## 2022-11-17 MED ORDER — PANTOPRAZOLE SODIUM 40 MG IV SOLR: 40.0000 mg | INTRAVENOUS | Status: DC

## 2022-11-17 MED ORDER — SODIUM CHLORIDE 0.9 % IV SOLN: 500.0000 mg | INTRAVENOUS | Status: DC

## 2022-11-17 MED ORDER — EPINEPHRINE 1 MG/10ML IJ SOSY
PREFILLED_SYRINGE | INTRAMUSCULAR | Status: AC
  Filled 2022-11-17: qty 30

## 2022-11-17 MED ORDER — INSULIN ASPART 100 UNIT/ML IJ SOLN: 0.0000 [IU] | Freq: Three times a day (TID) | INTRAMUSCULAR | Status: DC

## 2022-11-17 MED ORDER — FINASTERIDE 5 MG PO TABS
5.0000 mg | ORAL_TABLET | Freq: Every day | ORAL | Status: DC
  Administered 2022-11-17: 5 mg via ORAL
  Filled 2022-11-17: qty 1

## 2022-11-17 MED ORDER — SODIUM CHLORIDE 0.9 % IV SOLN: INTRAVENOUS | Status: DC

## 2022-11-17 MED ORDER — DOCUSATE SODIUM 50 MG/5ML PO LIQD: 100.0000 mg | Freq: Two times a day (BID) | ORAL | Status: DC

## 2022-11-17 MED ORDER — SODIUM CHLORIDE 0.9 % IV BOLUS
2000.0000 mL | Freq: Once | INTRAVENOUS | Status: DC
  Administered 2022-11-17: 2000 mL via INTRAVENOUS

## 2022-11-17 MED ORDER — ONDANSETRON HCL 4 MG/2ML IJ SOLN
INTRAMUSCULAR | Status: AC
  Filled 2022-11-17: qty 2

## 2022-11-17 MED ORDER — POLYETHYLENE GLYCOL 3350 17 G PO PACK: 17.0000 g | PACK | Freq: Every day | ORAL | Status: DC

## 2022-11-17 MED ORDER — SODIUM CHLORIDE 0.9 % IV SOLN: 250.0000 mL | INTRAVENOUS | Status: DC

## 2022-11-17 MED ORDER — PANTOPRAZOLE SODIUM 40 MG PO TBEC
40.0000 mg | DELAYED_RELEASE_TABLET | Freq: Every day | ORAL | Status: DC
  Administered 2022-11-17: 40 mg via ORAL
  Filled 2022-11-17 (×2): qty 1

## 2022-11-17 MED ORDER — ASPIRIN 81 MG PO CHEW
324.0000 mg | CHEWABLE_TABLET | Freq: Once | ORAL | Status: AC
  Administered 2022-11-17: 324 mg via ORAL
  Filled 2022-11-17: qty 4

## 2022-11-17 MED ORDER — POLYETHYLENE GLYCOL 3350 17 G PO PACK: 17.0000 g | PACK | Freq: Every day | ORAL | Status: DC | PRN

## 2022-11-17 MED ORDER — DEXTROSE 50 % IV SOLN: 50.0000 mL | INTRAVENOUS | Status: DC | PRN

## 2022-11-17 MED ORDER — SODIUM CHLORIDE 0.9 % IV SOLN: 1.0000 g | INTRAVENOUS | Status: DC

## 2022-11-17 MED ORDER — TAMSULOSIN HCL 0.4 MG PO CAPS
0.4000 mg | ORAL_CAPSULE | Freq: Every day | ORAL | Status: DC
  Administered 2022-11-17: 0.4 mg via ORAL
  Filled 2022-11-17: qty 1

## 2022-11-18 LAB — STREP PNEUMONIAE URINARY ANTIGEN: Strep Pneumo Urinary Antigen: NEGATIVE

## 2022-11-18 NOTE — ED Notes (Signed)
Writer spoke with [name] in patient's contacts who told Probation officer that it is okay for Molli Barrows to take patient belongings home with her.

## 2022-11-18 NOTE — ED Notes (Signed)
[name] next of kin verbalized over phone to release Patient's belongings to Nils Flack, niece of patient.

## 2022-11-19 LAB — HEMOGLOBIN A1C
Hgb A1c MFr Bld: 8.6 % — ABNORMAL HIGH (ref 4.8–5.6)
Mean Plasma Glucose: 200 mg/dL

## 2022-11-19 LAB — LEGIONELLA PNEUMOPHILA SEROGP 1 UR AG: L. pneumophila Serogp 1 Ur Ag: NEGATIVE

## 2022-11-24 NOTE — ED Notes (Signed)
FIRST NURSE NOTE:  Pt from home [facility] for vomiting  and cough since yesterday. Vomiting bile, refused zofran with EMS. Has vomited several times today. On abx currently for "lung infx" since 2 weeks. Has stage 4 renal cancer, not on treatment  162mL NS A fib (hx same), rate 120 160/90 CBG 68, hx DM

## 2022-11-24 NOTE — ED Provider Notes (Signed)
[facility] Provider Note    Event Date/Time   First MD Initiated Contact with Patient 12-08-22 1501     (approximate)   History   Emesis   HPI  [name] is a 62 y.o. male   Past medical history of CKD, COPD, AF no AC, CAD, HLD here with cough dyspnea, n/v.  Has also had subjective fevers and chills.  The symptoms started last night.  He has had chest pain, non radiating as well.  Bowel movements have been normal no blood or melena or diarrhea.  No GU symptoms.  History was obtained via the patient.      Physical Exam   Triage Vital Signs: ED Triage Vitals  Enc Vitals Group     BP 12-08-22 1233 (!) 154/104     Pulse Rate 12/08/2022 1233 (!) 102     Resp 08-Dec-2022 1233 (!) 22     Temp 08-Dec-2022 1233 99.2 F (37.3 C)     Temp src --      SpO2 12-08-2022 1233 96 %     Weight December 08, 2022 1234 200 lb (90.7 kg)     Height --      Head Circumference --      Peak Flow --      Pain Score 08-Dec-2022 1233 6     Pain Loc --      Pain Edu? --      Excl. in [place]? --     Most recent vital signs: Vitals:   Dec 08, 2022 1233 Dec 08, 2022 1505  BP: (!) 154/104 (!) 145/76  Pulse: (!) 102 100  Resp: (!) 22 20  Temp: 99.2 F (37.3 C)   SpO2: 96% 97%    General: Awake, no distress.  CV:  Good peripheral perfusion.  Resp:  Normal effort.  Abd:  No distention.  Other:  He appears tired, but awake alert conversant, oriented.  He is nontoxic-appearing.  His abdomen is soft without rigidity or guarding but he describes mild tenderness diffusely, his lungs have no obvious wheezing or focalities or rales.  Skin appears warm well-perfused, euvolemic.   ED Results / Procedures / Treatments   Labs (all labs ordered are listed, but only abnormal results are displayed) Labs Reviewed  BASIC METABOLIC PANEL - Abnormal; Notable for the following components:      Result Value   Chloride 118 (*)    CO2 15 (*)    Glucose, Bld 64 (*)    BUN 62 (*)    Creatinine, Ser  4.34 (*)    GFR, Estimated 15 (*)    All other components within normal limits  CBC - Abnormal; Notable for the following components:   Hemoglobin 12.0 (*)    MCV 79.5 (*)    MCH 24.4 (*)    RDW 18.2 (*)    All other components within normal limits  CBG MONITORING, ED - Abnormal; Notable for the following components:   Glucose-Capillary 58 (*)    All other components within normal limits  CBG MONITORING, ED - Abnormal; Notable for the following components:   Glucose-Capillary 184 (*)    All other components within normal limits  TROPONIN I (HIGH SENSITIVITY) - Abnormal; Notable for the following components:   Troponin I (High Sensitivity) 69 (*)    All other components within normal limits  TROPONIN I (HIGH SENSITIVITY) - Abnormal; Notable for the following components:   Troponin I (High Sensitivity) 204 (*)    All other components within  normal limits  RESP PANEL BY RT-PCR (RSV, FLU A&B, COVID)  [id number]  CBG MONITORING, ED     I reviewed labs and they are notable for he has a creatinine of 4.34 and an initial troponin of 69 which is not far from his baseline.  He was initially hypoglycemic 58 but got some D10 and improved to 180s.  EKG  ED ECG REPORT I, Lucillie Garfinkel, the attending physician, personally viewed and interpreted this ECG.   Date: Nov 22, 2022  EKG Time: 1240  Rate: 125  Rhythm: atrial fibrillation, rate 120s  Axis: rad  Intervals: non  ST&T Change: Inferolateral T wave inversions    RADIOLOGY I independently reviewed and interpreted chest x-ray and see some opacification of the right lower lung field   PROCEDURES:  Critical Care performed: Yes, see critical care procedure note(s)  .Critical Care  Performed by: Lucillie Garfinkel, MD Authorized by: Lucillie Garfinkel, MD   Critical care provider statement:    Critical care time (minutes):  30   Critical care was necessary to treat or prevent imminent or life-threatening deterioration of the following conditions:   Cardiac failure   Critical care was time spent personally by me on the following activities:  Development of treatment plan with patient or surrogate, discussions with consultants, evaluation of patient's response to treatment, examination of patient, ordering and review of laboratory studies, ordering and review of radiographic studies, ordering and performing treatments and interventions, pulse oximetry, re-evaluation of patient's condition and review of old [place] ED: Medications  azithromycin (ZITHROMAX) 500 mg in sodium chloride 0.9 % 250 mL IVPB (has no administration in time range)  cefTRIAXone (ROCEPHIN) 1 g in sodium chloride 0.9 % 100 mL IVPB (has no administration in time range)  sodium chloride 0.9 % bolus 2,000 mL (has no administration in time range)  aspirin chewable tablet 324 mg (has no administration in time range)  ondansetron (ZOFRAN) injection 4 mg (4 mg Intravenous Given 22-Nov-2022 1241)  dextrose 5 % in lactated ringers infusion (0 mLs Intravenous Stopped 2022-11-22 1553)    Consultants:  I spoke with hospitalist for admission and regarding care plan for this patient.   IMPRESSION / MDM / ASSESSMENT AND PLAN / ED COURSE  I reviewed the triage vital signs and the nursing notes.                              Differential diagnosis includes, but is not limited to, pna, viral URI, ACS, PE, abdominal infection   The patient is on the cardiac monitor to evaluate for evidence of arrhythmia and/or significant heart rate changes.  MDM: This is a patient with cardiac risk factors, COPD with multiple complaints including chest pain and respiratory infectious symptoms.  He is not wheezing and this does not appear to be COPD exacerbation.  He has opacities on chest x-ray so will cover for community-acquired pneumonia with IV antibiotics.  He has had chest pain and some nausea, with a lateral T wave inversions on his EKG, atrial fibrillation with RVR 120s  normotensive, will give aspirin and heparinize for NSTEMI given his troponin bump from 60s to 200s.  Will not pharmacologically rate controlled in the setting of evidence of pneumonia, instead give 2 L of crystalloid and reassess heart rate.  Admit.  Considered PE but less likely in the setting of opacification and infectious symptoms, will defer on CT angiogram given poor renal  function at this time, heparinized already for NSTEMI    Patient's presentation is most consistent with acute presentation with potential threat to life or bodily function.       FINAL CLINICAL IMPRESSION(S) / ED DIAGNOSES   Final diagnoses:  Pneumonia of right lower lobe due to infectious organism  Nausea and vomiting, unspecified vomiting type  NSTEMI (non-ST elevated myocardial infarction) ([place])     Rx / DC Orders   ED Discharge Orders     None        Note:  This document was prepared using Dragon voice recognition software and may include unintentional dictation errors.    Lucillie Garfinkel, MD 2022-12-05 [phone]

## 2022-11-24 NOTE — Progress Notes (Incomplete)
       CROSS COVER NOTE  NAME: MARSHALL KAMPF MRN: [id number] DOB : 1961/02/28 ATTENDING PHYSICIAN: Ivor Costa, MD   CODE BLUE NOTE  Patient Name: NISSIM FLEISCHER   MRN: [id number]   Date of Birth/ Sex: Mar 14, 1961 , male      Admission Date: 2022/11/21  Attending Provider: Ivor Costa, MD  Primary Diagnosis: CAP (community acquired pneumonia)   Cardiopulmonary Resuscitation Directed by: Neomia Glass DNP, FNP-BC   I personally directed ancillary staff and/or performed CPR in an effort to regain return of spontaneous circulation. ED MD ran initial code for ~25 minutes and I was alerted of Code Blue in progress at 02-12-2204. I presented to bedside and assumed ACLS Team Lead.  Indication: Mr Waldridge was found unresponsive in ED 5Hall. Code blue was called at Feb 11, 2145 and he was found to be in asystole, at recheck rhythm was fine VFIB and he received (1) 200J defibrillation, at times he had thready pulses that were quickly lost, subsequent pulse checks showed PEA on the monitor. At the time of my arrival to bedside, ACLS protocol had been underway for ~25 minutes. ROSC obtained at 11-Feb-2217 and he was in Atrial Fibrillation HR 113 SPO2 91% on 100% FiO2 RR 21 BP 177/88. Mr Phaneuf again lost a pulse at 02/11/2241 he remained in PEA. Time of Death 2248-02-12.    Technical Description:  - CPR performance duration:  32 minutes and 9 minutes  - Was defibrillation or cardioversion used? Yes   - Was external pacer placed? No  - Was patient intubated pre/post CPR? Yes    Medications Administered: Y = Yes; Blank = No Amiodarone    Atropine    Calcium  Y  Epinephrine  Y  Lidocaine    Magnesium  Y  Norepinephrine  Y  Phenylephrine    Sodium bicarbonate  Y  Vasopressin   D50              Post CPR evaluation:  - Final Status - Was patient successfully resuscitated ? No - What is current rhythm? Asystole - What is current hemodynamic status? N/A   Miscellaneous Information:        - Family Notified? Yes.  Niece Rodman Recupero [phone] contacted for IPAL and then again after Mr Robideau was pronounced deceased    - Additional notes/ transfer status:  Discussed with PCCM. Mr Kozar coded again prior to transfer up to ICU. TOD 02-12-48 21-Nov-2022.       To reach the provider On-Call:   7AM- 7PM see care teams to locate the attending and reach out to them via [email or web address]. 7PM-7AM contact night-coverage If you still have difficulty reaching the appropriate provider, please page the [facility] (Director on Call) for [facility] on amion for assistance  This document was prepared using Set designer software and may include unintentional dictation errors.  Neomia Glass DNP, MBA, FNP-BC Nurse Practitioner [facility] Pager [phone]

## 2022-11-24 NOTE — ED Provider Notes (Signed)
   Procedure Name: Intubation Date/Time: November 20, 2022 10:23 PM  Performed by: Lucillie Garfinkel, MDPre-anesthesia Checklist: Patient identified, Patient being monitored, Emergency Drugs available, Timeout performed and Suction available Oxygen Delivery Method: Ambu bag Preoxygenation: Pre-oxygenation with 100% oxygen Induction Type: Rapid sequence Ventilation: Mask ventilation without difficulty Laryngoscope Size: Glidescope and 4 Tube size: 7.5 mm Number of attempts: 1 Airway Equipment and Method: Video-laryngoscopy Placement Confirmation: ETT inserted through vocal cords under direct vision, CO2 detector, Breath sounds checked- equal and bilateral and Positive ETCO2 Secured at: 26 cm Tube secured with: ETT holder Future Recommendations: Recommend- induction with short-acting agent, and alternative techniques readily available       Lucillie Garfinkel, MD 11-20-2022 2224

## 2022-11-24 NOTE — ED Notes (Signed)
Pulse palpated at this time

## 2022-11-24 NOTE — Consult Note (Signed)
ANTICOAGULATION CONSULT NOTE - Initial Consult  Pharmacy Consult for Heparin Indication: chest pain/ACS  Allergies  Allergen Reactions   Codeine Nausea And Vomiting   Codeine    Lisinopril Other (See Comments)    Hypoglycemia   Lisinopril    Onion    Onion Rash    Patient Measurements: Weight: 90.7 kg (200 lb) Heparin Dosing Weight: 90.7 kg  Vital Signs: Temp: 99.2 F (37.3 C) (12/25 1233) BP: 145/76 (12/25 1505) Pulse Rate: 100 (12/25 1505)  Labs: Recent Labs    11-25-2022 1241 11-25-2022 1505  HGB 12.0*  --   HCT 39.1  --   PLT 171  --   CREATININE 4.34*  --   TROPONINIHS 69* 204*    Estimated Creatinine Clearance: 21.3 mL/min (A) (by C-G formula based on SCr of 4.34 mg/dL (H)).   Medical History: Past Medical History:  Diagnosis Date   Atrial fibrillation ([place])    CAD in native artery    Diabetes mellitus    Hyperlipidemia    Renal disorder    Reports stage 4 kidney disease, pt not on dialysis    Medications:  (Not in a hospital admission)  Scheduled:   aspirin  324 mg Oral Once   Infusions:   azithromycin     cefTRIAXone (ROCEPHIN)  IV     sodium chloride     PRN:  Anti-infectives (From admission, onward)    Start     Dose/Rate Route Frequency Ordered Stop   Nov 25, 2022 1615  azithromycin (ZITHROMAX) 500 mg in sodium chloride 0.9 % 250 mL IVPB        500 mg 250 mL/hr over 60 Minutes Intravenous  Once 2022/11/25 1607     25-Nov-2022 1615  cefTRIAXone (ROCEPHIN) 1 g in sodium chloride 0.9 % 100 mL IVPB        1 g 200 mL/hr over 30 Minutes Intravenous  Once 2022/11/25 1607         Assessment: Pharmacy consulted to start heparin infusion for ACS. Trops elevated into the 200s. No DOAC PTA. PMH: CKD, afib but no AC, CAD, and HLD.   Goal of Therapy:  Heparin level 0.3-0.7 units/ml Monitor platelets by anticoagulation protocol: Yes   Plan:  Give 4000 units bolus x 1 Start heparin infusion at 1250 units/hr Check anti-Xa level in 8 hours and daily  while on heparin Continue to monitor H&H and platelets  Oswald Hillock, PharmD, BCPS 11/25/22,4:15 PM

## 2022-11-24 NOTE — ED Provider Triage Note (Addendum)
Emergency Medicine Provider Triage Evaluation Note  Bradan AMDREW OBOYLE , a 62 y.o. male  was evaluated in triage.  Pt complains of nausea and vomiting that started last night. Vomiting started before the pain. No known fever.  Physical Exam  There were no vitals taken for this visit. Gen:   Awake, no distress   Resp:  Normal effort  MSK:   Moves extremities without difficulty  Other:    Medical Decision Making  Medically screening exam initiated at 12:33 PM.  Appropriate orders placed.  Travone K Sitar was informed that the remainder of the evaluation will be completed by another provider, this initial triage assessment does not replace that evaluation, and the importance of remaining in the ED until their evaluation is complete.  Glucose is 58. D5LR ordered. He is actively vomiting. Zofran ordered as well.   Victorino Dike, FNP December 12, 2022 [place], [place], FNP 12-Dec-2022 1244

## 2022-11-24 NOTE — ED Provider Notes (Signed)
While boarding in the ED awaiting inpatient placement this patient coded.  Found to be in systole on the monitor and pulseless in the hallway, CPR immediately initiated as well as bag mask ventilations.  Moved into the room, ACLS performed, initial rhythm asystole, then on recheck fine V-fib received 1 defibrillation at 200 J, CPR continued.  Intubated see note.  He was then found to be in PEA arrest.  Thready pulses, then lost pulses, PEA arrest, ACLS continued.  Given magnesium, calcium, bicarb.  Multiple rounds of epinephrine.  Hospitalist arrived report was given and they continued ACLS.    Lucillie Garfinkel, MD Nov 18, 2022 2216

## 2022-11-24 NOTE — ED Notes (Addendum)
John, [place], and [place], [place], cleaned up this pt due to him soiling his clothes and bed. Pt requested to save his soiled clothes in a pt belongings bag stating he "would wash them at home." Pts clothes placed in pt belonging bag and set with the rest of the pts items.   Privacy and pericare was provided and clean brief, sheets, blankets, and chux were set in place. Pt repositioned in bed and denied needing anything else at this time.

## 2022-11-24 NOTE — ED Notes (Signed)
Pulse found 2218

## 2022-11-24 NOTE — Progress Notes (Signed)
   11/18/22 0100  Clinical Encounter Type  Visited With Patient and family together  Visit Type Initial  Referral From Nurse  Consult/Referral To Chaplain   Chaplain responded to request for support amid patient death. Chaplain escorted family members to view body and say goodbye. Chaplain provided compassionate presence and reflective listening as family members shared stories about patient's life. Chaplain services are available for follow up as needed.

## 2022-11-24 NOTE — ED Notes (Signed)
Norepi titrated 39mcg/min

## 2022-11-24 NOTE — ED Triage Notes (Signed)
Pt states vomiting since last night with hot and cold spells. Pt is complaining of nausea, vomiting, abdominal pain and chest pain.
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
## 2022-11-24 NOTE — ED Notes (Signed)
EDP Wang called to [facility] Code called :2146 Compressions started 2145 Bagged 2146 Epi 1 2148 Epi 2 2152  Pulse Check: PEA 2153 [place] Rump  started: 2153

## 2022-11-24 NOTE — Progress Notes (Signed)
    NAME: [name] MRN: [id number] DOB : 08-07-1961 ATTENDING PHYSICIAN: Ivor Costa, MD    Interdisciplinary Goals of Care Family Meeting   Date carried out: 12-09-22  Location of the meeting: Phone conference  Member's involved: Nurse Practitioner and Family Member or next of kin [name] [phone]  Durable Power of [place] or acting medical decision maker: [name], Niece   Discussion: We discussed goals of care for [name] .  Code Blue was called on [name] this evening and he was successfully resuscitated after 32 minute down. I explained tonight's events to [facility] and communicated that with [name] extended down time I am concerned he will lose a pulse again and that because of his extended down time he will be unlikely to have meaningful neurological recovery. I communicated that [name] is intubated with two vasoactive agents infusing. I inquired if [name] knew what [name]'s wishes would be in this circumstance and if he would like [place] to continue aggressive medical care or allow him to pass naturally. [name] would like to reach out to other family members and have family discussion on how to proceed and she will call back with their decision.   Code status:   Code Status: Full Code   Disposition: Continue current acute care  Time spent for the meeting: 5 mins   To reach the provider On-Call:   7AM- 7PM see care teams to locate the attending and reach out to them via [email or web address]. 7PM-7AM contact night-coverage If you still have difficulty reaching the appropriate provider, please page the [facility] (Director on Call) for [facility] on amion for assistance  This document was prepared using Set designer software and may include unintentional dictation errors.  Neomia Glass DNP, MBA, FNP-BC Nurse Practitioner [facility] Pager [phone]

## 2022-11-24 NOTE — Progress Notes (Addendum)
Consulted by [facility] NP due to cardiac arrest in ED while awaiting admission under [facility] service. Placed new PCCM admission orders for patient when I was alerted by [facility] NP bedside that the patient had cardiac arrested again. Arrived bedside, in agreement with [facility] NP when time of death called at 22:49.  Domingo Pulse [place], AGACNP-BC Acute Care Nurse Practitioner [facility]   [phone] / [phone] Please see Amion for pager details.

## 2022-11-24 NOTE — Death Summary Note (Addendum)
DEATH SUMMARY   Patient Details  Name: [name] MRN: [id number] DOB: [birth year] [id number], Roselind Rily, MD Admission/Discharge Information   Admit Date:  2022/11/20  Date of Death: Date of Death: 11/20/22  Time of Death: Time of Death: 2248-02-11  Length of Stay: 1   Principle Cause of death: Sepsis due to CAP (community acquired pneumonia) and NSTEMI (non-ST elevated myocardial infarction).   Hospital Diagnoses: Principal Problem:   CAP (community acquired pneumonia) Active Problems:   NSTEMI (non-ST elevated myocardial infarction) ([place])   CAD (coronary artery disease)   Chronic diastolic CHF (congestive heart failure) (HCC)   HLD (hyperlipidemia)   Atrial fibrillation, chronic (HCC)   COPD (chronic obstructive pulmonary disease) (HCC)   BPH (benign prostatic hyperplasia)   Chronic renal insufficiency, stage V (HCC)   Hypoglycemia   Type II diabetes mellitus with renal manifestations ([place])   Abdominal pain   Sepsis ([place])   Cardiac arrest [facility])   Hospital Course:  Pt was admitted due to sepsis secondary to CAP. Also found to have NSTEMI with trop 69 --> 204. Pt was started on antibiotics (rocephin and azithromycin) for CAP and IV heparin for NSTEMI. Pt also has abdominal pain with unclear etiology. CT of abdomen/pelvis  is negative for acute intra-abdominal issues.  Liver function is normal and lipase is normal 33. Possibly due to gastroparesis due to poorly controlled diabetes. The A &P was made as below. Consulted Dr. Humphrey Rolls of cardiology. Per report,  while boarding in ED awaiting for placement, pt developed cardiac arrest. Code Blue was called at 21:46. PCR and ACLS were started immediately.   Per report, pt was in asystole, at recheck rhythm was fine V Fib. Pt received (1) 200J defibrillation, at times he had thready pulses that were quickly lost, subsequent pulse checks showed PEA on the monitor. ROSC was obtained at 10-Feb-2217, but unfortunately, He again lost a pulse  at 2242. He remained in PEA. Time of Death 02/11/2248.   Assessment and Plan:  CAP (community acquired pneumonia) and sepsis: Patient meets critical for sepsis with heart rate 102 and RR 22. Pending lactic acid level   - Admited to tele bed as inpt - IV Rocephin and azithromycin - Mucinex for cough  - Bronchodilators - Urine legionella and S. pneumococcal antigen - Follow up blood culture x2, sputum culture - Check procalcitonin and trend lactic acid level per sepsis protocol - IVF: pt received 1L of D5 in ED due to hypoglycemia. Then at 27 cc/h of NS (patient has diastolic congestive heart failure, limiting aggressive IV fluids treatment)   NSTEMI (non-ST elevated myocardial infarction) ([place]) and CAD: s/p CABG. Trop  69 --> 204. Consulted Dr. Humphrey Rolls - started IV heparin  - Trend Trop - prn Nitroglycerin, Fentanyl for chest pain - Aspirin, lipitor  - Risk factor stratification: check FLP and A1C  - check UDS   Chronic diastolic CHF (congestive heart failure) ([place]): 2D echo on 02/19/2022 showed EF of 55%.  Patient does not have leg edema.  CHF seem to be compensated. -Continue Lasix, starting next day -check BNP   HLD (hyperlipidemia) -Lipitor   Atrial fibrillation, chronic ([place]): HR 100-120.  Patient is not taking anticoagulants at home. -Cardizem and metoprolol -Patient is on IV heparin at admission    COPD (chronic obstructive pulmonary disease) ([place]): No wheezing on auscultation. -Bronchodilators   BPH (benign prostatic hyperplasia) -Flomax   Chronic renal insufficiency, stage V ([place]): Renal function stable -Follow-up by BMP   Hypoglycemia: Initial  blood sugar 64, which improved to 184 after giving 1 L of D5 in ED -Check CBG every hour -D50 as needed -Hold glargine insulin   Type II diabetes mellitus with renal manifestations [facility]): Recent A1c 11.8, poorly controlled.  Patient is taking Novolin, Victoza, glargine insulin 65 units daily -Hold glargine insulin due to  hypoglycemia -SSI   Abdominal pain: Etiology is not clear.  CT of abdomen is negative for acute intra-abdominal issues.  Liver function normal, lipase normal 33.  Gastroparesis is potential differential diagnosis due to poorly controlled diabetes -As needed Tylenol, Zofran -Scheduled Reglan 5 mg tid -Pepcid 20 mg twice daily by IV   GERD -Protonix      Procedures:   Consultations: consulted cardiologist, but pt was not seen yet before his death   The results of significant diagnostics from this hospitalization (including imaging, microbiology, ancillary and laboratory) are listed below for reference.   Significant Diagnostic Studies: CT CHEST ABDOMEN PELVIS WO CONTRAST  Result Date: 2022/11/28 CLINICAL DATA:  Cough and chest pain. Abdominal pain with nausea and vomiting EXAM: CT CHEST, ABDOMEN AND PELVIS WITHOUT CONTRAST TECHNIQUE: Multidetector CT imaging of the chest, abdomen and pelvis was performed following the standard protocol without IV contrast. RADIATION DOSE REDUCTION: This exam was performed according to the departmental dose-optimization program which includes automated exposure control, adjustment of the mA and/or kV according to patient size and/or use of iterative reconstruction technique. COMPARISON:  CT abdomen and pelvis dated December 08, 2018 FINDINGS: CT CHEST FINDINGS Cardiovascular: Normal heart size. No pericardial effusion. Normal caliber thoracic aorta with moderate calcified plaque. Severe three-vessel coronary artery calcifications status post CABG. Mediastinum/Nodes: Small hiatal hernia. Thyroid is unremarkable. No pathologically enlarged lymph nodes seen in the chest. Lungs/Pleura: Central airways are patent. Mild bilateral bronchial wall thickening. Patchy upper lobe peribronchovascular predominant consolidations and nodular opacities. Small bilateral pleural effusions. Musculoskeletal: No chest wall mass or suspicious bone lesions identified. CT ABDOMEN PELVIS  FINDINGS Hepatobiliary: No focal liver abnormality gallstones with no evidence of gallbladder wall thickening. No biliary ductal dilation. Pancreas: Unremarkable. No pancreatic ductal dilatation or surrounding inflammatory changes. Spleen: Normal in size without focal abnormality. Adrenals/Urinary Tract: Bilateral adrenal glands are unremarkable. No hydronephrosis or nephrolithiasis. Bladder is unremarkable. Stomach/Bowel: Stomach is within normal limits. No evidence of bowel wall thickening, distention, or inflammatory changes. Vascular/Lymphatic: Aortic atherosclerosis. No enlarged abdominal or pelvic lymph nodes. Reproductive: Prostate is unremarkable. Other: No abdominal wall hernia or abnormality. No abdominopelvic ascites. Musculoskeletal: No acute or significant osseous findings. IMPRESSION: 1. Patchy upper lobe peribronchovascular predominant consolidations and nodular opacities, likely infectious or inflammatory no exactly how. Recommend follow-up chest CT in 3 months to ensure resolution. 2. Small bilateral pleural effusions. 3. No acute findings in the abdomen or pelvis. 4. Cholelithiasis with no evidence of acute cholecystitis. 5. Aortic Atherosclerosis (ICD10-I70.0). Electronically Signed   By: Yetta Glassman M.D.   On: 11-28-2022 16:00   DG Chest 2 View  Result Date: 11/28/22 CLINICAL DATA:  Chest pain, vomiting EXAM: CHEST - 2 VIEW COMPARISON:  04/06/2021 FINDINGS: Cardiac size is within normal limits. Metallic sutures in the sternum and surgical clips in mediastinum suggesting possible previous coronary bypass surgery. There are no signs of alveolar pulmonary edema. Increased interstitial markings are seen in right lower lung field. Rest of the lung fields are unremarkable. There is possible minimal blunting of right lateral CP angle. There is no pneumothorax. IMPRESSION: Subtle increased interstitial markings are seen in right lower lung field suggesting crowding of  bronchovascular  structures due to poor inspiration or early interstitial pneumonia. Possible minimal right pleural effusion. Electronically Signed   By: Elmer Picker M.D.   On: 20-Nov-2022 13:27    Microbiology: No results found for this or any previous visit (from the past 240 hour(s)).  Time spent:  20 minutes  Signed: Ivor Costa, MD 20-Nov-2022

## 2022-11-24 NOTE — ED Notes (Signed)
Provider in triage notified of CBG 58. D5LR ordered and zofran given. First RN notified.

## 2022-11-24 NOTE — ED Notes (Signed)
CPR started at 2240 1mg  Epi in at 2243 50 mEq Sodium Bicarb in at 2244 Pulse check 2244 No pulse 2244  PEA rhythm 2245 CPR resumed 1 mg Epi at 2245 Pulse check 2246 No pulse 2246 PEA rhythm 2247 CPR resumed 1 mg Epi in at 2247 Pulse check 2249 No palpated pulse  PEA rhythm  2249 time of death call by Neomia Glass NP

## 2022-11-24 NOTE — ED Notes (Signed)
2200 defib

## 2022-11-24 DEATH — deceased
